# Patient Record
Sex: Male | Born: 1990
Health system: Southern US, Community
[De-identification: ages and names within clinical notes are randomized; demographics above are authoritative.]

## PROBLEM LIST (undated history)

## (undated) DIAGNOSIS — T7840XA Allergy, unspecified, initial encounter: Secondary | ICD-10-CM

## (undated) DIAGNOSIS — J45909 Unspecified asthma, uncomplicated: Secondary | ICD-10-CM

## (undated) HISTORY — DX: Allergy, unspecified, initial encounter: T78.40XA

---

## 2013-10-21 ENCOUNTER — Encounter (HOSPITAL_COMMUNITY): Payer: Self-pay | Admitting: Emergency Medicine

## 2013-10-21 ENCOUNTER — Emergency Department (HOSPITAL_COMMUNITY)
Admission: EM | Admit: 2013-10-21 | Discharge: 2013-10-21 | Disposition: A | Discharge status: Home / Self Care | Payer: BC Managed Care – PPO | Attending: Emergency Medicine | Admitting: Emergency Medicine

## 2013-10-21 DIAGNOSIS — H5789 Other specified disorders of eye and adnexa: Secondary | ICD-10-CM | POA: Insufficient documentation

## 2013-10-21 DIAGNOSIS — J069 Acute upper respiratory infection, unspecified: Secondary | ICD-10-CM | POA: Insufficient documentation

## 2013-10-21 DIAGNOSIS — F17200 Nicotine dependence, unspecified, uncomplicated: Secondary | ICD-10-CM | POA: Diagnosis not present

## 2013-10-21 DIAGNOSIS — R079 Chest pain, unspecified: Secondary | ICD-10-CM | POA: Insufficient documentation

## 2013-10-21 DIAGNOSIS — J45901 Unspecified asthma with (acute) exacerbation: Secondary | ICD-10-CM | POA: Insufficient documentation

## 2013-10-21 DIAGNOSIS — R51 Headache: Secondary | ICD-10-CM | POA: Diagnosis not present

## 2013-10-21 DIAGNOSIS — J029 Acute pharyngitis, unspecified: Secondary | ICD-10-CM | POA: Diagnosis not present

## 2013-10-21 HISTORY — DX: Unspecified asthma, uncomplicated: J45.909

## 2013-10-21 MED ORDER — ALBUTEROL SULFATE (2.5 MG/3ML) 0.083% IN NEBU
2.5000 mg | INHALATION_SOLUTION | Freq: Once | RESPIRATORY_TRACT | Status: AC
  Administered 2013-10-21: 2.5 mg via RESPIRATORY_TRACT
  Filled 2013-10-21: qty 3

## 2013-10-21 MED ORDER — PREDNISONE 20 MG PO TABS: 40.0000 mg | ORAL_TABLET | Freq: Every day | ORAL | Status: DC

## 2013-10-21 MED ORDER — IPRATROPIUM BROMIDE 0.02 % IN SOLN
0.5000 mg | Freq: Once | RESPIRATORY_TRACT | Status: AC
  Administered 2013-10-21: 0.5 mg via RESPIRATORY_TRACT
  Filled 2013-10-21: qty 2.5

## 2013-10-21 MED ORDER — ALBUTEROL SULFATE HFA 108 (90 BASE) MCG/ACT IN AERS
2.0000 | INHALATION_SPRAY | Freq: Once | RESPIRATORY_TRACT | Status: AC
Start: 2013-10-21 — End: 2013-10-21
  Administered 2013-10-21: 2 via RESPIRATORY_TRACT
  Filled 2013-10-21: qty 6.7

## 2013-10-21 NOTE — ED Notes (Signed)
Pt presents with nasal drainage, sore throat and difficulty breathing. +wheezing bilat, dry cough noted. Pt speaking in complete sentences.

## 2013-10-21 NOTE — Discharge Instructions (Signed)
Please read and follow all provided instructions.  Your diagnoses today include:  1. Asthma exacerbation   2. Upper respiratory infection     Tests performed today include:  Vital signs. See below for your results today.   Medications prescribed:   Albuterol inhaler - medication that opens up your airway  Use inhaler as follows: 1-2 puffs with spacer every 4 hours as needed for wheezing, cough, or shortness of breath.    Prednisone - steroid medicine   It is best to take this medication in the morning to prevent sleeping problems. If you are diabetic, monitor your blood sugar closely and stop taking Prednisone if blood sugar is over 300. Take with food to prevent stomach upset.   Take any prescribed medications only as directed.  Home care instructions:  Follow any educational materials contained in this packet.  Follow-up instructions: Please follow-up with your primary care provider in the next 3 days for further evaluation of your symptoms and management of your asthma.  Return instructions:   Please return to the Emergency Department if you experience worsening symptoms.  Please return with worsening wheezing, shortness of breath, or difficulty breathing.  Return with persistent fever above 101F.   Please return if you have any other emergent concerns.  Additional Information:  Your vital signs today were: BP 150/81   Pulse 90   Temp(Src) 98.8 F (37.1 C) (Oral)   Resp 20   Ht  (1.753 m)   Wt 180 lb (81.647 kg)   BMI 26.57 kg/m2   SpO2 97% If your blood pressure (BP) was elevated above 135/85 this visit, please have this repeated by your doctor within one month. --------------

## 2013-10-21 NOTE — ED Provider Notes (Signed)
Medical screening examination/treatment/procedure(s) were performed by non-physician practitioner and as supervising physician I was immediately available for consultation/collaboration.   EKG Interpretation None       Ana Liaw R. Verina Galeno, MD 10/21/13 2353 

## 2013-10-21 NOTE — ED Provider Notes (Signed)
CSN: [id number]     Arrival date & time 10/21/13  1940 History  This chart was scribed for non-physician practitioner working with [name], MD, by [name] ED Scribe. This patient was seen in room WTR6/WTR6 and the patient's care was started at 8:35 PM  Chief Complaint  Patient presents with  . Asthma   The history is provided by the patient and a parent. No language interpreter was used.   HPI Comments: Andre Cole is a 23 y.o. male with a history of asthma, who presents to the Emergency Department complaining of cough, rhinorrhea, sneezing, sore throat, and headache that began 3 days ago. Patient states that he does not use any medications for his asthma and per mother, his previous flare up was last year and he was diagnosed with bronchitis at the time. Patient states that he has been using allegra and halls with minimal relief. Patient denies associated fever or ear pain. Patient is a current some day smoker.  Past Medical History  Diagnosis Date  . Asthma    History reviewed. No pertinent past surgical history. No family history on file. History  Substance Use Topics  . Smoking status: Current Some Day Smoker    Types: Cigars  . Smokeless tobacco: Not on file  . Alcohol Use: Yes     Comment: occ    Review of Systems  Constitutional: Negative for fever and chills.  HENT: Positive for congestion, rhinorrhea, sneezing and sore throat. Negative for ear pain and sinus pressure.   Eyes: Positive for redness. Negative for discharge and itching.  Respiratory: Positive for cough, chest tightness and wheezing.   Gastrointestinal: Negative for nausea and vomiting.  Skin: Negative for color change.  Neurological: Positive for headaches.   Allergies  Review of patient's allergies indicates no known allergies.  Home Medications   Prior to Admission medications   Not on File   Triage Vitals: BP 150/81  Pulse 90  Temp(Src) 98.8 F (37.1 C) (Oral)  Resp 20  Ht   (1.753 m)  Wt 180 lb (81.647 kg)  BMI 26.57 kg/m2  SpO2 97%  Physical Exam  Nursing note and vitals reviewed. Constitutional: He is oriented to person, place, and time. He appears well-developed and well-nourished. No distress.  HENT:  Head: Normocephalic and atraumatic.  Right Ear: Tympanic membrane, external ear and ear canal normal.  Left Ear: Tympanic membrane, external ear and ear canal normal.  Nose: Mucosal edema and rhinorrhea present.  Mouth/Throat: Uvula is midline and mucous membranes are normal. Mucous membranes are not dry. No trismus in the jaw. No uvula swelling. Posterior oropharyngeal erythema present. No oropharyngeal exudate, posterior oropharyngeal edema or tonsillar abscesses.  Eyes: Conjunctivae and EOM are normal. Right eye exhibits no discharge. Left eye exhibits no discharge.  Neck: Normal range of motion. Neck supple. No tracheal deviation present.  Cardiovascular: Normal rate, regular rhythm and normal heart sounds.   Pulmonary/Chest: Effort normal. No respiratory distress. He has wheezes (lungs bilaterally, expiratory). He has no rales.  Abdominal: Soft. There is no tenderness.  Musculoskeletal: Normal range of motion.  Lymphadenopathy:    He has no cervical adenopathy.  Neurological: He is alert and oriented to person, place, and time.  Skin: Skin is warm and dry.  Psychiatric: He has a normal mood and affect. His behavior is normal.   ED Course  Procedures (including critical care time)  DIAGNOSTIC STUDIES: Oxygen Saturation is 97% on RA, normal by my interpretation.  COORDINATION OF CARE: 8:39 PM- Discussed plans to order nebulizer breathing treatment. Will prescribe prednisone to patient. Pt advised of plan for treatment and pt agrees.  Labs Review Labs Reviewed - No data to display  Imaging Review No results found.   EKG Interpretation None      Vital signs reviewed and are as follows: Filed Vitals:   10/21/13 2144  BP: 125/70   Pulse: 82  Temp:   Resp: 20   9:46 PM patient notes improvement after breathing treatment. He has only mild bibasilar expiratory wheeze. He was offered another breathing treatment and he declines. Will discharge to home with albuterol HFA. Patient encouraged to fill and take prednisone if his symptoms persist for more than 24-48 hours. Patient does not want to start prednisone now because of its side effects. No prednisone given in ED. Counseled on supportive care for URI symptoms. Patient urged to return with fever, worsening trouble breathing, increased work of breathing, or other concerns.   MDM   Final diagnoses:  Asthma exacerbation  Upper respiratory infection   Asthma: treated in emergency department with breathing treatment with improvement. Prednisone for home if patient has persistent symptoms. Patient declines initiation of prednisone now. Patient has URI which is triggered. I do not suspect pneumonia given lung exam and no fever.  URI: Treat supportively.  I personally performed the services described in this documentation, which was scribed in my presence. The recorded information has been reviewed and is accurate.    Renne Crigler, PA-C 10/21/13 2149

## 2014-04-25 ENCOUNTER — Ambulatory Visit (INDEPENDENT_AMBULATORY_CARE_PROVIDER_SITE_OTHER): Payer: BLUE CROSS/BLUE SHIELD

## 2014-04-25 ENCOUNTER — Ambulatory Visit (INDEPENDENT_AMBULATORY_CARE_PROVIDER_SITE_OTHER): Payer: BLUE CROSS/BLUE SHIELD | Admitting: Emergency Medicine

## 2014-04-25 VITALS — BP 118/68 | HR 83 | Temp 98.8°F | Resp 18 | Ht 70.0 in | Wt 208.0 lb

## 2014-04-25 DIAGNOSIS — M545 Low back pain, unspecified: Secondary | ICD-10-CM

## 2014-04-25 DIAGNOSIS — M25511 Pain in right shoulder: Secondary | ICD-10-CM

## 2014-04-25 DIAGNOSIS — T148 Other injury of unspecified body region: Secondary | ICD-10-CM | POA: Diagnosis not present

## 2014-04-25 DIAGNOSIS — Z23 Encounter for immunization: Secondary | ICD-10-CM

## 2014-04-25 DIAGNOSIS — S43101A Unspecified dislocation of right acromioclavicular joint, initial encounter: Secondary | ICD-10-CM

## 2014-04-25 DIAGNOSIS — T07XXXA Unspecified multiple injuries, initial encounter: Secondary | ICD-10-CM

## 2014-04-25 MED ORDER — HYDROCODONE-ACETAMINOPHEN 5-325 MG PO TABS: 1.0000 | ORAL_TABLET | ORAL | Status: DC | PRN
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
## 2014-04-25 NOTE — Patient Instructions (Signed)
Shoulder Separation You have a shoulder separation (acromioclavicular separation). This occurs when an injury stretches or tears the ligaments that connect the top of the shoulder blade to the collar bone. Injury causes these bones to separate. A sling or splint may be applied to limit your range of motion. HOME CARE INSTRUCTIONS   Apply ice to the injury for 15-20 minutes each hour while awake for the first 2 days. Put the ice in a plastic bag. Place a towel between the bag of ice and your skin.  Avoid activities that increase pain.  Wear your sling or splint for as long as directed by your caregiver. If you remove the sling to dress or bathe, be sure your arm remains in the same position as when the sling is on. Do not lift your arm.  The splint must be tightened by another person every day. Tighten it enough to keep the shoulders held back. Allow enough room to place the index finger between the body and strap. Loosen the splint immediately if you feel numbness or tingling in your hands.  Only take over-the-counter or prescription medicines for pain, discomfort, or fever as directed by your caregiver.  If this is a severe injury, you may need a referral to a specialist. It is very important to keep any follow-up appointments in order to avoid any type of permanent shoulder disability or chronic pain problems. SEEK MEDICAL CARE IF:  Pain or swelling to the injury site increases and is not relieved with medications. SEEK IMMEDIATE MEDICAL CARE IF:  Your arm becomes numb, pale, cold, or there are abnormal feelings (sensations) shooting into your fingertips. Document Released: 11/06/2004 Document Revised: 06/13/2013 Document Reviewed: 09/08/2006 ExitCare Patient Information 2015 [employer]. This information is not intended to replace advice given to you by your health care provider. Make sure you discuss any questions you have with your health care provider.  

## 2014-12-13 ENCOUNTER — Telehealth: Payer: Self-pay | Admitting: Emergency Medicine
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
## 2014-12-15 NOTE — Telephone Encounter (Signed)
Faxed back.

## 2015-03-09 ENCOUNTER — Ambulatory Visit (INDEPENDENT_AMBULATORY_CARE_PROVIDER_SITE_OTHER): Payer: BLUE CROSS/BLUE SHIELD | Admitting: Physician Assistant

## 2015-03-09 VITALS — BP 120/70 | HR 71 | Temp 98.2°F | Resp 16 | Ht 69.75 in | Wt 211.8 lb

## 2015-03-09 DIAGNOSIS — Z202 Contact with and (suspected) exposure to infections with a predominantly sexual mode of transmission: Secondary | ICD-10-CM

## 2015-03-09 DIAGNOSIS — Z113 Encounter for screening for infections with a predominantly sexual mode of transmission: Secondary | ICD-10-CM

## 2015-03-09 MED ORDER — METRONIDAZOLE 500 MG PO TABS: 2000.0000 mg | ORAL_TABLET | Freq: Once | ORAL | Status: DC

## 2015-03-09 NOTE — Progress Notes (Signed)
   03/09/2015 2:24 PM   DOB: [birth year] / MRN: [id number]  SUBJECTIVE:  Andre Cole is a 25 y.o. male presenting today because his girlfriend tested positive for trichomas.  This was last Tuesday. She was also screened for HIV, syphillis, gonorhea and chlamydia and these were negative.  They have been together 4 years and this is now over.  Patient reports he had another partner in December and contracted the illness and gave this to his ex girlfriend.    He is having no symptoms today.   He has No Known Allergies.   He  has a past medical history of Asthma and Allergy.    He  reports that he has been smoking Cigars.  He does not have any smokeless tobacco history on file. He reports that he drinks about 0.6 oz of alcohol per week. He reports that he does not use illicit drugs. He  has no sexual activity history on file. The patient  has no past surgical history on file.  His family history is not on file.  Review of Systems  Constitutional: Negative for fever.  Gastrointestinal: Negative for nausea and abdominal pain.  Genitourinary: Negative for dysuria, urgency and frequency.  Musculoskeletal: Negative for myalgias.  Skin: Negative for rash.    Problem list and medications reviewed and updated by myself where necessary, and exist elsewhere in the encounter.   OBJECTIVE:  BP 120/70 mmHg  Pulse 71  Temp(Src) 98.2 F (36.8 C) (Oral)  Resp 16  Ht 5' 9.75" (1.772 m)  Wt 211 lb 12.8 oz (96.072 kg)  BMI 30.60 kg/m2  SpO2 98%  Physical Exam  Constitutional: He is oriented to person, place, and time.  Cardiovascular: Normal rate.   Pulmonary/Chest: Effort normal.  Neurological: He is alert and oriented to person, place, and time.  Skin: Skin is warm.  Vitals reviewed.   No results found for this or any previous visit (from the past 72 hour(s)).  No results found.  ASSESSMENT AND PLAN  Chaze was seen today for std testing.  Diagnoses and all orders for this  visit:  Exposure to trichomonas  Routine screening for STI (sexually transmitted infection) -     HIV antibody -     RPR -     GC/Chlamydia Probe Amp  Other orders -     metroNIDAZOLE (FLAGYL) 500 MG tablet; Take 4 tablets (2,000 mg total) by mouth once. Do not consume alcohol with this medication.   The patient was advised to call or return to clinic if he does not see an improvement in symptoms or to seek the care of the closest emergency department if he worsens with the above plan.   Deliah Boston, MHS, PA-C [facility] 03/09/2015 2:24 PM

## 2015-03-10 LAB — HIV ANTIBODY (ROUTINE TESTING W REFLEX): HIV: NONREACTIVE

## 2015-03-10 LAB — RPR

## 2015-03-10 LAB — GC/CHLAMYDIA PROBE AMP
CT Probe RNA: NOT DETECTED
GC Probe RNA: NOT DETECTED

## 2016-02-06 IMAGING — CR DG LUMBAR SPINE COMPLETE 4+V
5 series · 5 of 5 positions shown · non-contrast
Comparison: None.

CLINICAL DATA: Right-sided lower back pain without sciatica. Pain
in the joint, shoulder region.

EXAM:
LUMBAR SPINE - COMPLETE 4+ VIEW

[AP (1 of 2)]
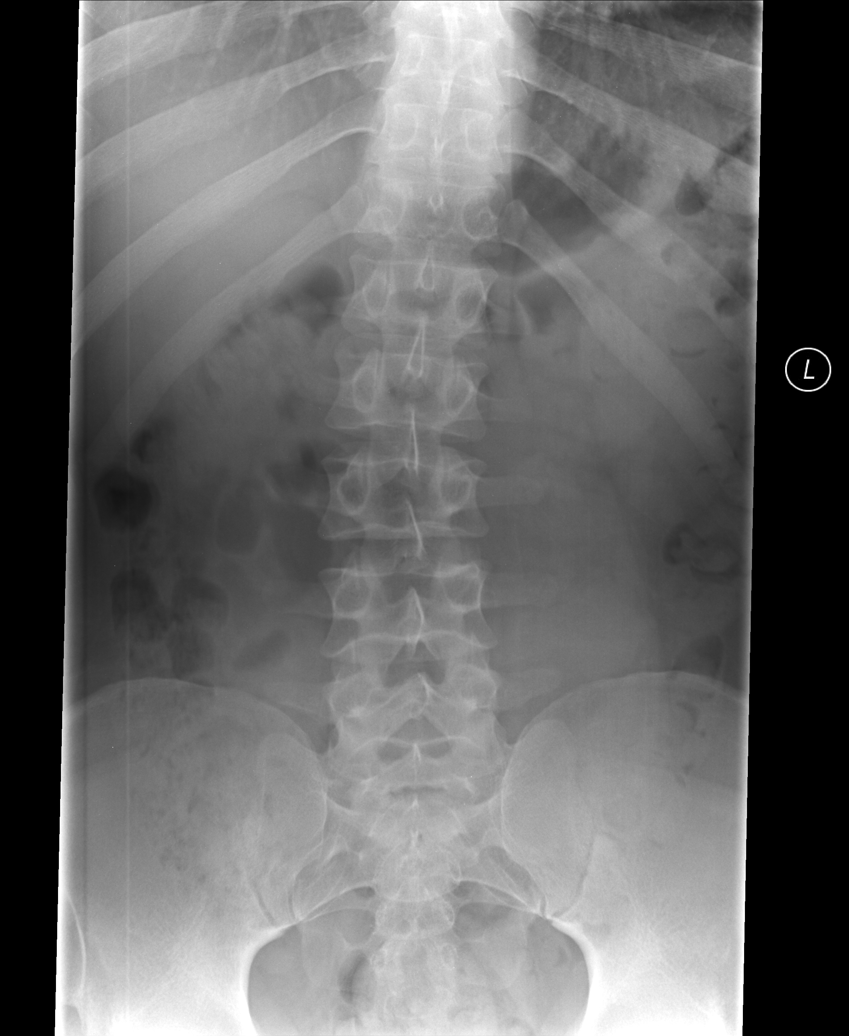

[AP (2 of 2)]
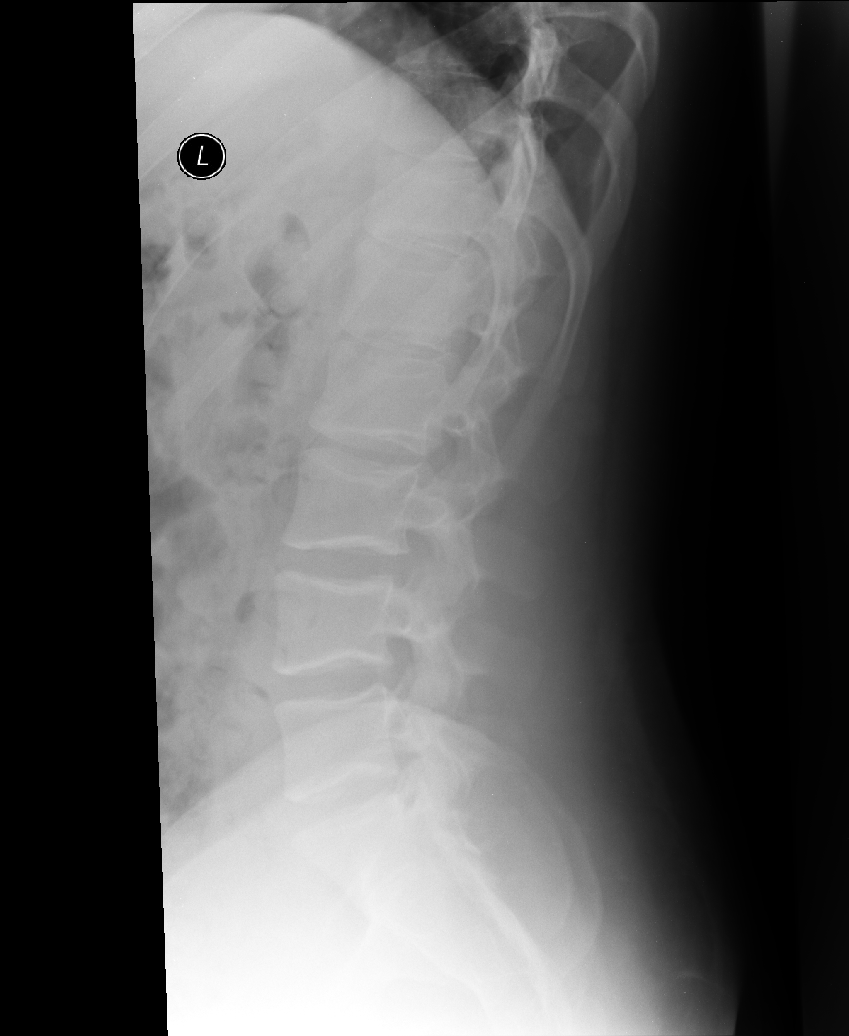

[l5 s1]
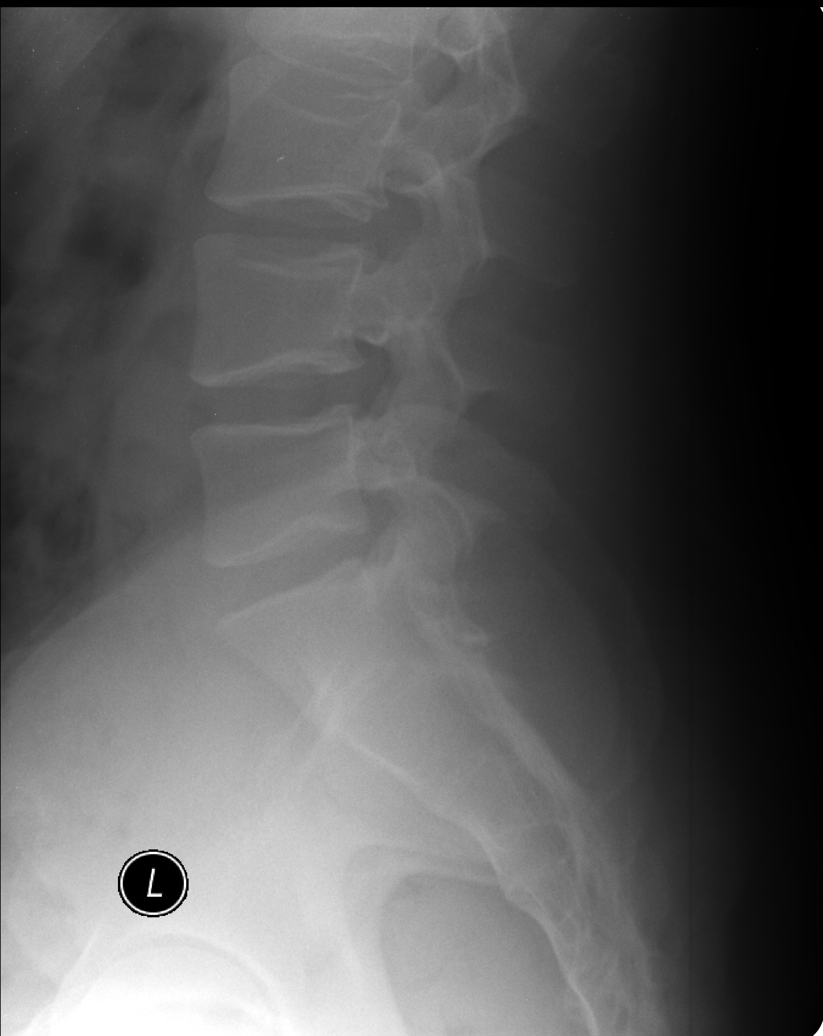

[rpo]
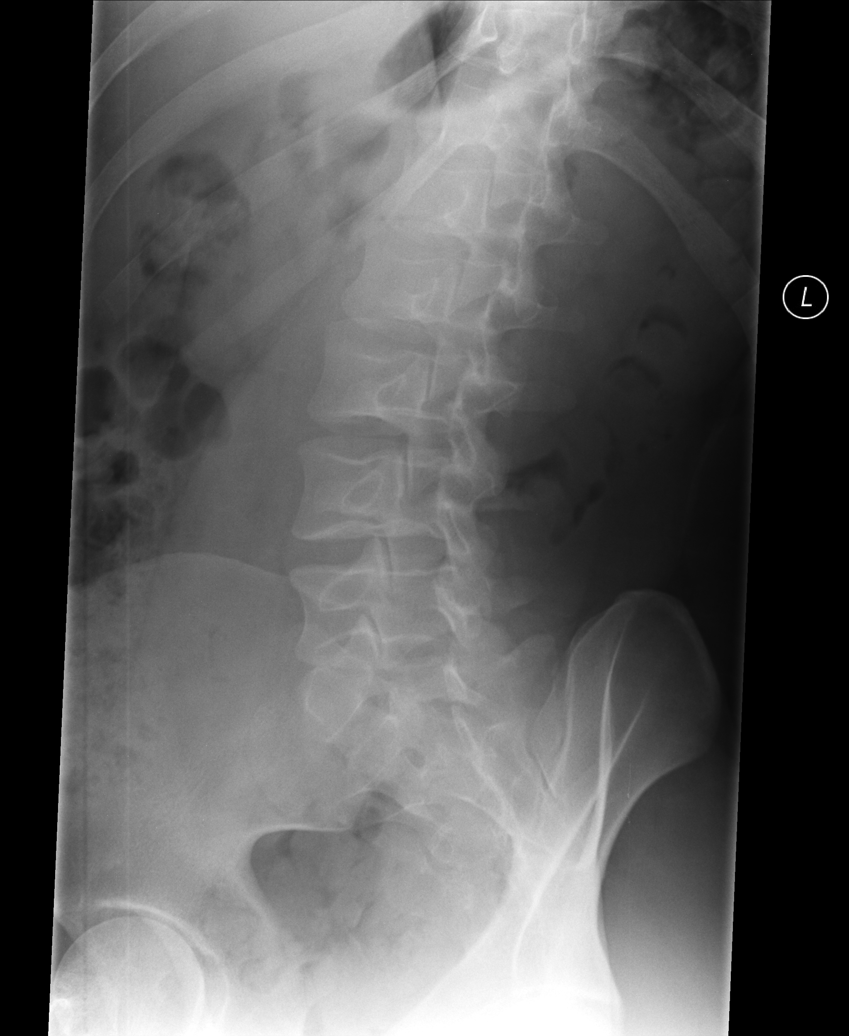

[lpo]
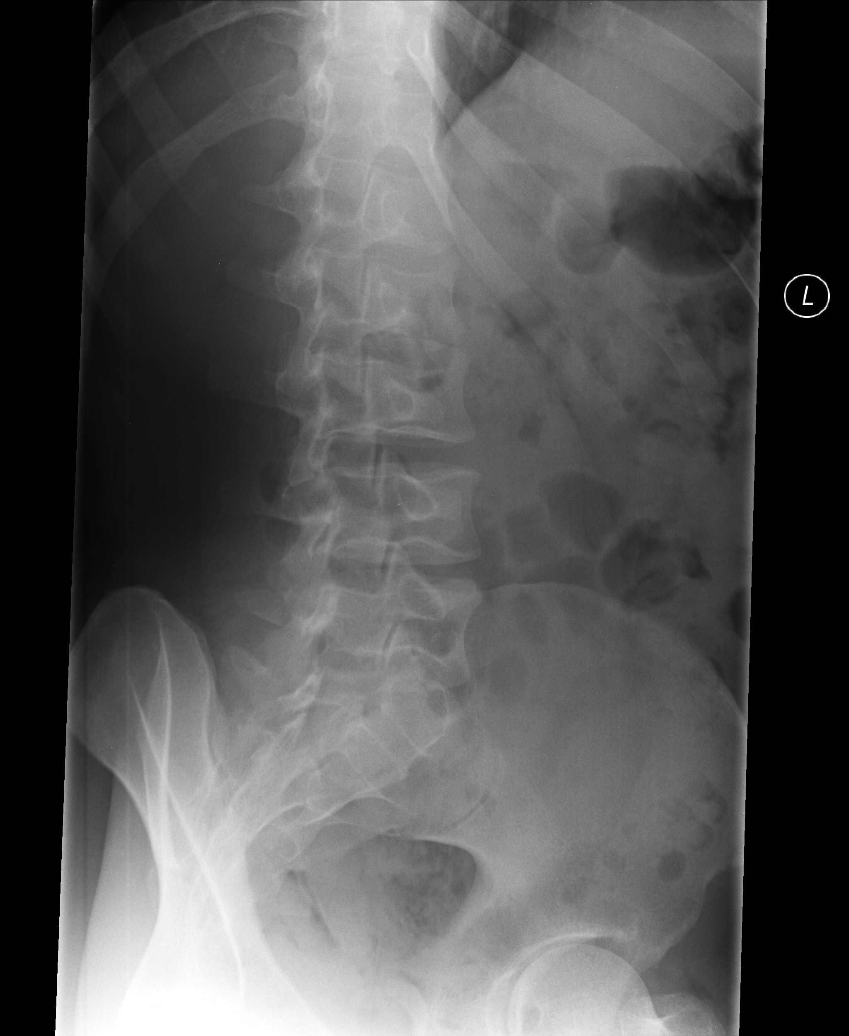

[5 of 5 positions shown; findings below may reference images not displayed]

FINDINGS: There is no evidence of lumbar spine fracture. Alignment is normal.
Intervertebral disc spaces are maintained.
IMPRESSION: Negative.

## 2016-02-06 IMAGING — CR DG HIP (WITH OR WITHOUT PELVIS) 2-3V*R*
3 series · 3 of 3 positions shown · non-contrast
Comparison: None.

CLINICAL DATA: Right lower back pain and right hip pain. Initial
encounter.

EXAM:
RIGHT HIP (WITH PELVIS) 2-3 VIEWS

[AP (1 of 2)]
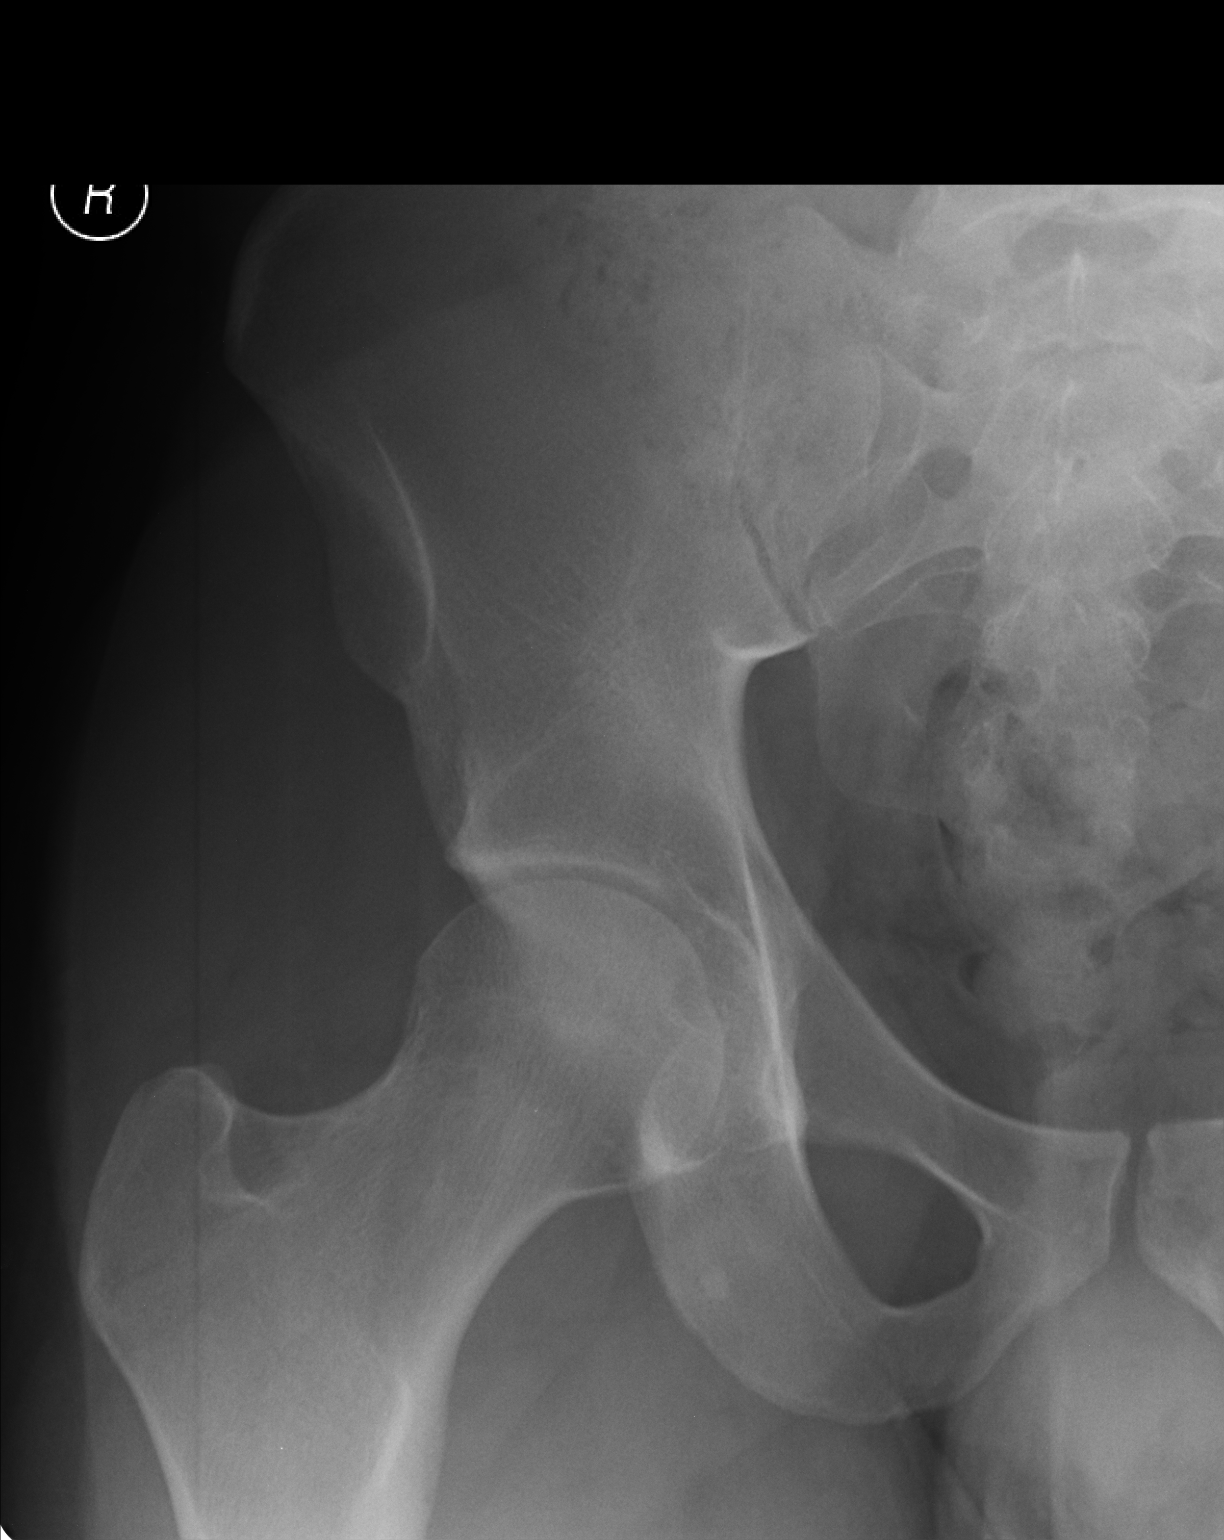

[lateral]
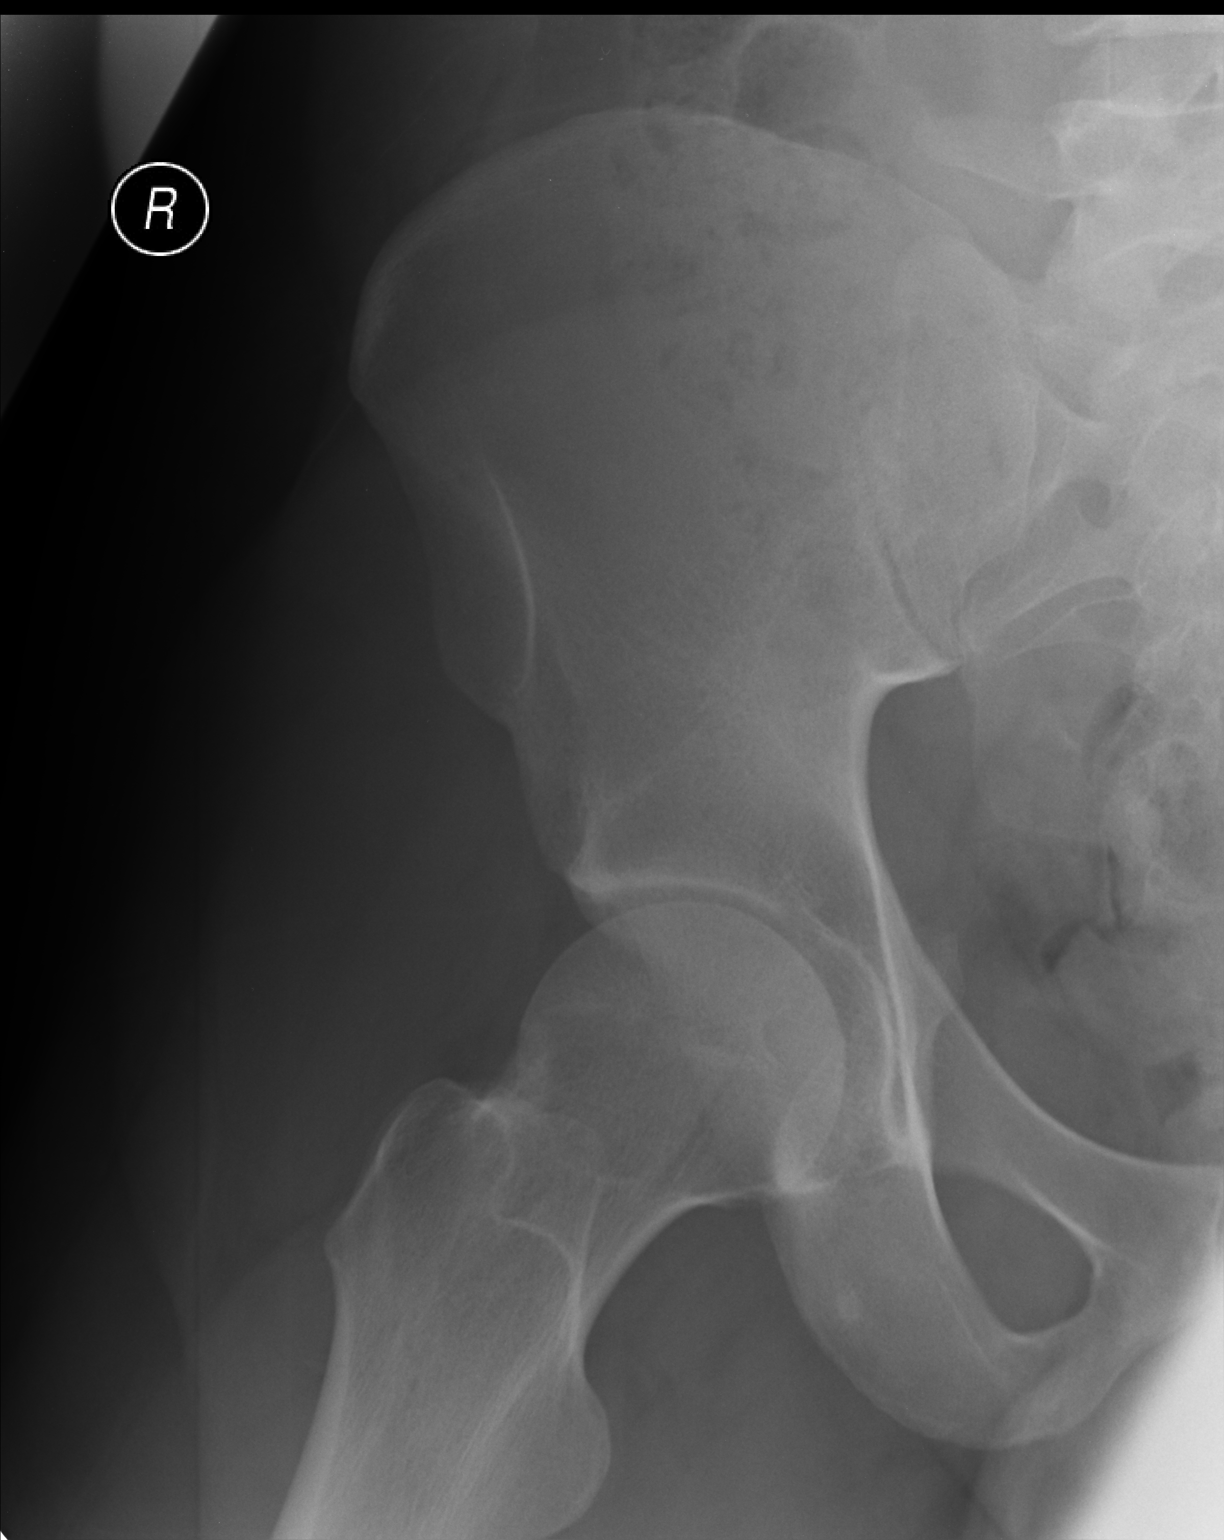

[AP (2 of 2)]
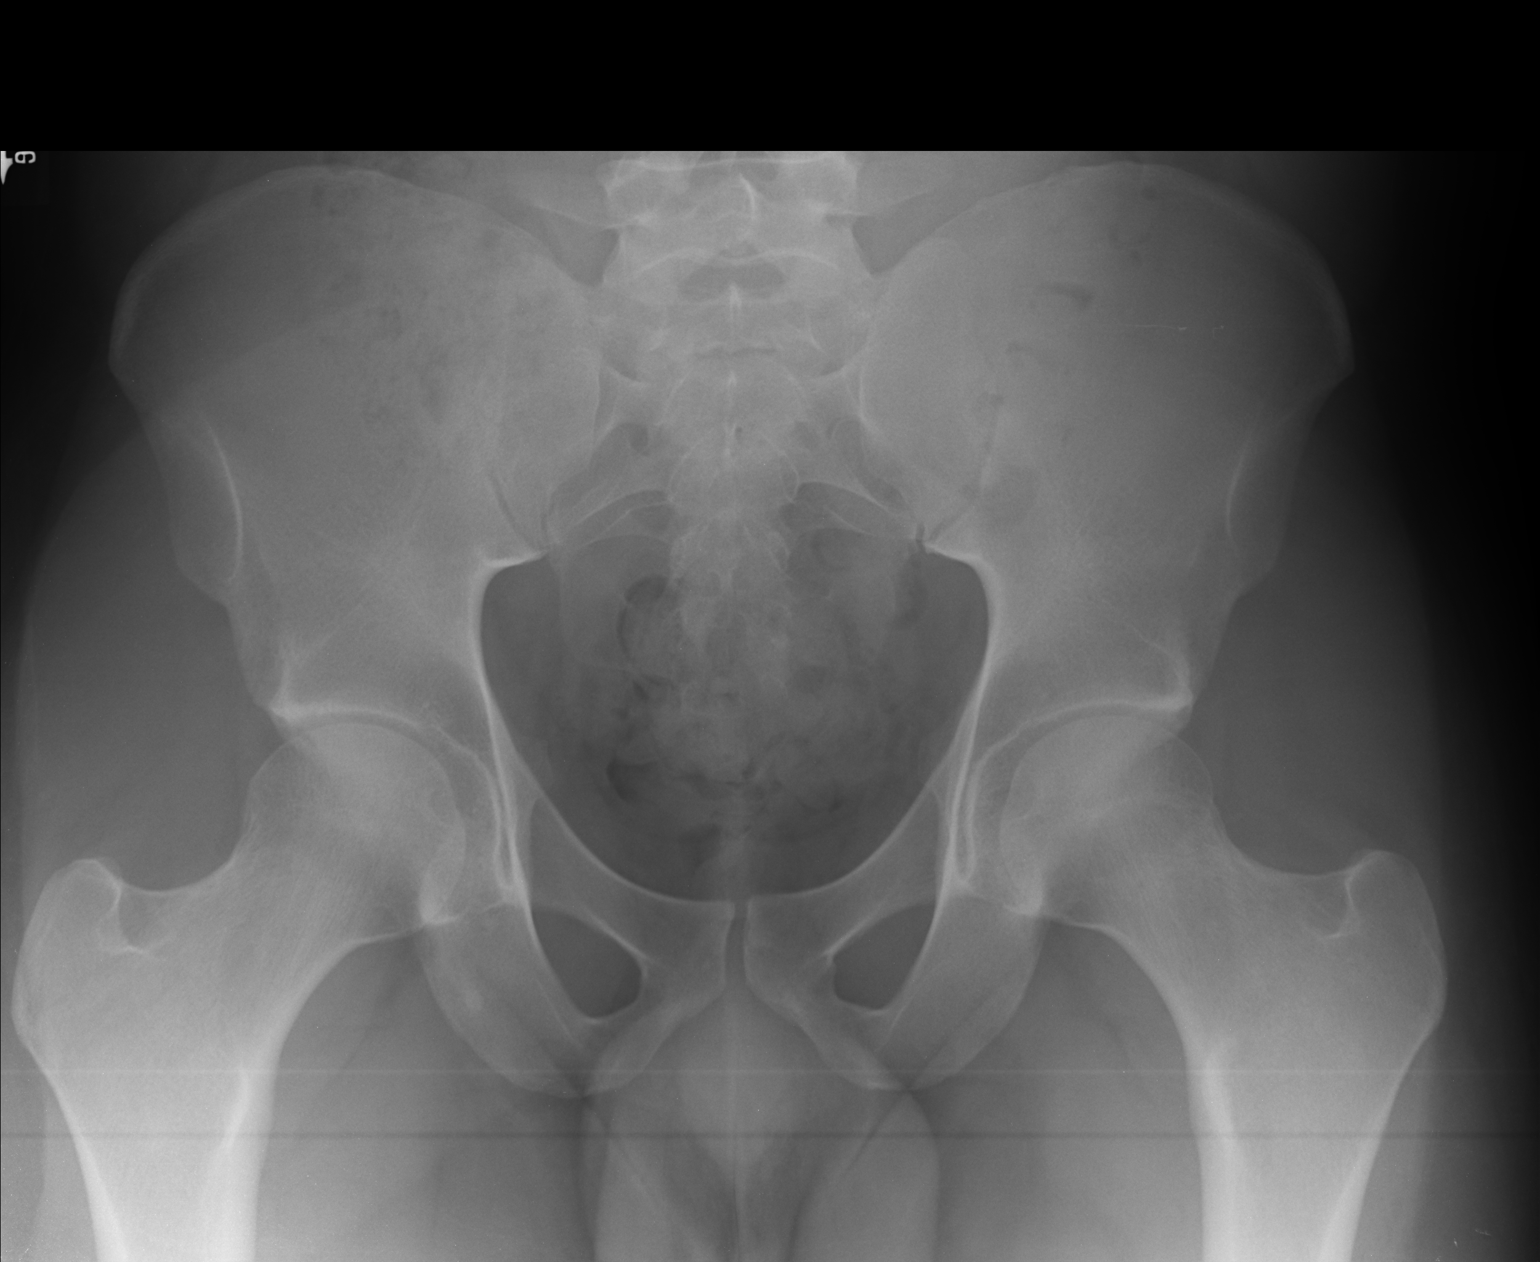

[3 of 3 positions shown; findings below may reference images not displayed]

FINDINGS: There is no evidence of fracture or dislocation. Both femoral heads
are seated normally within their respective acetabula. The proximal
right femur appears intact. No significant degenerative change is
appreciated. The sacroiliac joints are unremarkable in appearance.

The visualized bowel gas pattern is grossly unremarkable in
appearance.
IMPRESSION: No evidence of fracture or dislocation.

## 2016-05-01 ENCOUNTER — Ambulatory Visit (INDEPENDENT_AMBULATORY_CARE_PROVIDER_SITE_OTHER): Payer: BLUE CROSS/BLUE SHIELD | Admitting: Physician Assistant

## 2016-05-01 VITALS — BP 120/58 | HR 85 | Temp 98.3°F | Resp 16 | Ht 69.5 in | Wt 201.8 lb

## 2016-05-01 DIAGNOSIS — Z113 Encounter for screening for infections with a predominantly sexual mode of transmission: Secondary | ICD-10-CM | POA: Diagnosis not present

## 2016-05-01 DIAGNOSIS — Z114 Encounter for screening for human immunodeficiency virus [HIV]: Secondary | ICD-10-CM | POA: Diagnosis not present

## 2016-05-01 DIAGNOSIS — Z23 Encounter for immunization: Secondary | ICD-10-CM

## 2016-05-01 DIAGNOSIS — Z13228 Encounter for screening for other metabolic disorders: Secondary | ICD-10-CM | POA: Diagnosis not present

## 2016-05-01 DIAGNOSIS — Z13220 Encounter for screening for lipoid disorders: Secondary | ICD-10-CM

## 2016-05-01 DIAGNOSIS — Z8709 Personal history of other diseases of the respiratory system: Secondary | ICD-10-CM | POA: Insufficient documentation

## 2016-05-01 DIAGNOSIS — Z9109 Other allergy status, other than to drugs and biological substances: Secondary | ICD-10-CM

## 2016-05-01 DIAGNOSIS — Z0000 Encounter for general adult medical examination without abnormal findings: Secondary | ICD-10-CM

## 2016-05-01 DIAGNOSIS — Z130 Encounter for screening for diseases of the blood and blood-forming organs and certain disorders involving the immune mechanism: Secondary | ICD-10-CM

## 2016-05-01 NOTE — Progress Notes (Signed)
Andre Cole  MRN: [id number] DOB: [birth year]  PCP: No PCP Per Patient  Subjective:  Pt presents to clinic for a CPE.  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 H/O extrinsic asthma 05/01/2016  . Environmental allergies 05/01/2016    Review of Systems  Constitutional: Negative.   HENT: Negative.   Eyes: Negative.   Respiratory: Negative.   Cardiovascular: Negative.   Gastrointestinal: Negative.   Endocrine: Negative.   Genitourinary: Negative.   Musculoskeletal: Negative.   Skin: Negative.   Allergic/Immunologic: Negative.   Neurological: Negative.   Hematological: Negative.   Psychiatric/Behavioral: Negative.      Current Outpatient Prescriptions on File Prior to Visit  Medication Sig Dispense Refill  . fexofenadine (ALLEGRA) 180 MG tablet Take 180 mg by mouth daily. Reported on 03/09/2015     No current facility-administered medications on file prior to visit.     No Known Allergies  Social History   Social History  . Marital status: Single    Spouse name: N/A  . Number of children: N/A  . Years of education: N/A   Social History Main Topics  . Smoking status: Former Smoker    Types: Cigars    Quit date: 02/10/2014  . Smokeless tobacco: Never Used     Comment: no marjuina or cigars or cig since 2016 but vaps  . Alcohol use 0.6 oz/week    1 Standard drinks or equivalent per week     Comment: 2x/week - 2-4 liquior drinks a week  .  Drug use: No  . Sexual activity: Yes    Partners: Female   Other Topics Concern  . 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  No family history on file.   Objective:  BP (!) 120/58 (BP Location: Right Arm, Patient Position: Sitting, Cuff Size: Large)   Pulse 85   Temp 98.3 F (36.8 C) (Oral)   Resp 16   Ht 5' 9.5" (1.765 m)   Wt 201 lb 12.8 oz (91.5 kg)   SpO2 93%   BMI 29.37 kg/m   Physical Exam  Constitutional: He is oriented to person, place, and time and well-developed, well-nourished, and in no distress.  HENT:  Head: Normocephalic and atraumatic.  Right Ear: Hearing, tympanic membrane, external ear and ear canal normal.  Left Ear: Hearing, tympanic membrane, external ear and ear canal normal.  Nose: Nose normal.  Mouth/Throat: Uvula is midline, oropharynx is clear and moist and mucous membranes are normal.  Eyes: Conjunctivae and EOM are normal. Pupils are equal, round, and reactive to  light.  Neck: Trachea normal and normal range of motion. Neck supple. No thyroid mass and no thyromegaly present.  Cardiovascular: Normal rate, regular rhythm and normal heart sounds.   No murmur heard. Pulmonary/Chest: Effort normal and breath sounds normal.  Abdominal: Soft. Bowel sounds are normal. Hernia confirmed negative in the right inguinal area and confirmed negative in the left inguinal area.  Genitourinary: Testes/scrotum normal and penis normal.  Musculoskeletal: Normal range of motion.  Neurological: He is alert and oriented to person, place, and time. Gait normal.  Skin: Skin is warm and dry.  Tattoo presents on abd and back  Psychiatric: Mood, memory, affect and judgment normal.    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  Windell Hummingbird PA-C  [facility] 05/06/2016 9:36 PM

## 2016-05-01 NOTE — Patient Instructions (Addendum)
I will contact you with your lab results as soon as they are available.   If you have not heard from me in 2 weeks, please contact me.  The fastest way to get your results is to register for My Chart (see the instructions on the last page of this printout).    IF you received an x-ray today, you will receive an invoice from [facility]. Please contact [facility] at [phone] with questions or concerns regarding your invoice.   IF you received labwork today, you will receive an invoice from [facility]. Please contact [facility] at [phone] with questions or concerns regarding your invoice.   Our billing staff will not be able to assist you with questions regarding bills from these companies.  You will be contacted with the lab results as soon as they are available. The fastest way to get your results is to activate your My Chart account. Instructions are located on the last page of this paperwork. If you have not heard from us regarding the results in 2 weeks, please contact this office.    Keeping you healthy  Get these tests  Blood pressure- Have your blood pressure checked once a year by your healthcare provider.  Normal blood pressure is 120/80.  Weight- Have your body mass index (BMI) calculated to screen for obesity.  BMI is a measure of body fat based on height and weight. You can also calculate your own BMI at https://www.west-esparza.com/www.nhlbisupport.com/bmi/.  Cholesterol- Have your cholesterol checked regularly starting at age 26, sooner may be necessary if you have diabetes, high blood pressure, if a family member developed heart diseases at an early age or if you smoke.   Chlamydia, HIV, and other sexual transmitted disease- Get screened each year until the age of 26 then within three months of each new sexual partner.  Diabetes- Have your blood sugar checked regularly if you have high blood pressure, high cholesterol, a family history of diabetes or if you are overweight.  Get  these vaccines  Flu shot- Every fall.  Tetanus shot- Every 10 years.  Menactra- Single dose; prevents meningitis.  Take these steps  Don't smoke- If you do smoke, ask your healthcare provider about quitting. For tips on how to quit, go to www.smokefree.gov or call 1-800-QUIT-NOW.  Be physically active- Exercise 5 days a week for at least 30 minutes.  If you are not already physically active start slow and gradually work up to 30 minutes of moderate physical activity.  Examples of moderate activity include walking briskly, mowing the yard, dancing, swimming bicycling, etc.  Eat a healthy diet- Eat a variety of healthy foods such as fruits, vegetables, low fat milk, low fat cheese, yogurt, lean meats, poultry, fish, beans, tofu, etc.  For more information on healthy eating, go to www.thenutritionsource.org  Drink alcohol in moderation- Limit alcohol intake two drinks or less a day.  Never drink and drive.  Dentist- Brush and floss teeth twice daily; visit your dentis twice a year.  Depression-Your emotional health is as important as your physical health.  If you're feeling down, losing interest in things you normally enjoy please talk with your healthcare provider.  Gun Safety- If you keep a gun in your home, keep it unloaded and with the safety lock on.  Bullets should be stored separately.  Helmet use- Always wear a helmet when riding a motorcycle, bicycle, rollerblading or skateboarding.  Safe sex- If you may be exposed to a sexually transmitted infection, use a condom  Seat belts- Seat bels can save your life; always wear one.  Smoke/Carbon Monoxide detectors- These detectors need to be installed on the appropriate level of your home.  Replace batteries at least once a year.  Skin Cancer- When out in the sun, cover up and use sunscreen SPF 15 or higher.  Violence- If anyone is threatening or hurting you, please tell your healthcare provider.

## 2016-05-02 LAB — CBC WITH DIFFERENTIAL/PLATELET
BASOS: 0 %
Basophils Absolute: 0 10*3/uL (ref 0.0–0.2)
EOS (ABSOLUTE): 0.3 10*3/uL (ref 0.0–0.4)
Eos: 3 %
Hematocrit: 45.8 % (ref 37.5–51.0)
Hemoglobin: 15.2 g/dL (ref 13.0–17.7)
IMMATURE GRANS (ABS): 0 10*3/uL (ref 0.0–0.1)
Immature Granulocytes: 0 %
LYMPHS ABS: 1.9 10*3/uL (ref 0.7–3.1)
LYMPHS: 20 %
MCH: 27.7 pg (ref 26.6–33.0)
MCHC: 33.2 g/dL (ref 31.5–35.7)
MCV: 83 fL (ref 79–97)
Monocytes Absolute: 0.5 10*3/uL (ref 0.1–0.9)
Monocytes: 6 %
Neutrophils Absolute: 6.6 10*3/uL (ref 1.4–7.0)
Neutrophils: 71 %
Platelets: 324 10*3/uL (ref 150–379)
RBC: 5.49 x10E6/uL (ref 4.14–5.80)
RDW: 14.5 % (ref 12.3–15.4)
WBC: 9.4 10*3/uL (ref 3.4–10.8)

## 2016-05-02 LAB — CMP14+EGFR
A/G RATIO: 1.7 (ref 1.2–2.2)
ALBUMIN: 4.6 g/dL (ref 3.5–5.5)
ALK PHOS: 92 IU/L (ref 39–117)
ALT: 14 IU/L (ref 0–44)
AST: 23 IU/L (ref 0–40)
BILIRUBIN TOTAL: 0.5 mg/dL (ref 0.0–1.2)
BUN / CREAT RATIO: 19 (ref 9–20)
BUN: 14 mg/dL (ref 6–20)
CO2: 27 mmol/L (ref 18–29)
Calcium: 9.8 mg/dL (ref 8.7–10.2)
Chloride: 99 mmol/L (ref 96–106)
Creatinine, Ser: 0.75 mg/dL — ABNORMAL LOW (ref 0.76–1.27)
GFR calc non Af Amer: 127 mL/min/{1.73_m2} (ref >59)
GFR, EST AFRICAN AMERICAN: 147 mL/min/{1.73_m2} (ref >59)
Globulin, Total: 2.7 g/dL (ref 1.5–4.5)
Glucose: 92 mg/dL (ref 65–99)
POTASSIUM: 4.8 mmol/L (ref 3.5–5.2)
SODIUM: 142 mmol/L (ref 134–144)
Total Protein: 7.3 g/dL (ref 6.0–8.5)

## 2016-05-02 LAB — LIPID PANEL
Chol/HDL Ratio: 4.9 ratio units (ref 0.0–5.0)
Cholesterol, Total: 165 mg/dL (ref 100–199)
HDL: 34 mg/dL — ABNORMAL LOW (ref >39)
LDL Calculated: 87 mg/dL (ref 0–99)
Triglycerides: 220 mg/dL — ABNORMAL HIGH (ref 0–149)
VLDL Cholesterol Cal: 44 mg/dL — ABNORMAL HIGH (ref 5–40)

## 2016-05-02 LAB — HIV ANTIBODY (ROUTINE TESTING W REFLEX): HIV SCREEN 4TH GENERATION: NONREACTIVE

## 2016-05-02 LAB — SYPHILIS: RPR W/REFLEX TO RPR TITER AND TREPONEMAL ANTIBODIES, TRADITIONAL SCREENING AND DIAGNOSIS ALGORITHM: RPR Ser Ql: NONREACTIVE

## 2016-05-03 LAB — GC/CHLAMYDIA PROBE AMP
Chlamydia trachomatis, NAA: NEGATIVE
Neisseria gonorrhoeae by PCR: NEGATIVE

## 2016-09-04 ENCOUNTER — Telehealth: Payer: Self-pay | Admitting: Physician Assistant

## 2016-09-04 NOTE — Telephone Encounter (Signed)
Patient needs a forms completed based off his CPE in 04/2016 by Benny LennertSarah Weber. I gave the patient a copy of his form and I placed the other copy in Sarah's box on 09/04/16 please CALL THE PATIENT when the forms are ready to be picked up and place them UP FRONT IN THE PICK UP DRAWER when ready for him.  Thank you!

## 2016-09-08 NOTE — Telephone Encounter (Signed)
Please advise 

## 2016-09-08 NOTE — Telephone Encounter (Signed)
Left VM that paperwork is ready for pickup at front desk at 102.

## 2016-12-01 ENCOUNTER — Telehealth: Payer: Self-pay | Admitting: Physician Assistant

## 2016-12-01 NOTE — Telephone Encounter (Signed)
Pt also needs the copy of the actual PE summary

## 2016-12-01 NOTE — Telephone Encounter (Signed)
Pt left dept of justice forms to be filled out by Andre LennertSarah Cole from his March PE,   Best phone for pt is [phone]

## 2016-12-02 NOTE — Telephone Encounter (Signed)
Pt brought by another form that also needs to be filled out today.  [facility].

## 2016-12-03 NOTE — Telephone Encounter (Signed)
See message below aboutt forms pt need filled out by provider

## 2016-12-09 ENCOUNTER — Ambulatory Visit (INDEPENDENT_AMBULATORY_CARE_PROVIDER_SITE_OTHER): Payer: BLUE CROSS/BLUE SHIELD | Admitting: Physician Assistant

## 2016-12-09 DIAGNOSIS — Z111 Encounter for screening for respiratory tuberculosis: Secondary | ICD-10-CM

## 2016-12-09 NOTE — Progress Notes (Signed)

## 2016-12-09 NOTE — Telephone Encounter (Signed)
Pt coming to 104 to take care of this.

## 2016-12-09 NOTE — Telephone Encounter (Signed)
I have filled out the form - his color, peripheral vision and Tb screening has not been done.

## 2016-12-11 ENCOUNTER — Ambulatory Visit: Payer: BLUE CROSS/BLUE SHIELD | Admitting: Family Medicine

## 2016-12-11 LAB — TB SKIN TEST
Induration: 0 mm
TB Skin Test: NEGATIVE

## 2017-01-29 ENCOUNTER — Ambulatory Visit: Payer: BLUE CROSS/BLUE SHIELD | Admitting: Physician Assistant

## 2017-01-29 ENCOUNTER — Encounter: Payer: Self-pay | Admitting: Physician Assistant

## 2017-01-29 ENCOUNTER — Other Ambulatory Visit: Payer: Self-pay

## 2017-01-29 VITALS — BP 122/62 | HR 87 | Temp 98.8°F | Resp 18 | Ht 68.82 in | Wt 198.8 lb

## 2017-01-29 DIAGNOSIS — Z23 Encounter for immunization: Secondary | ICD-10-CM | POA: Diagnosis not present

## 2017-01-29 DIAGNOSIS — R229 Localized swelling, mass and lump, unspecified: Secondary | ICD-10-CM

## 2017-01-29 DIAGNOSIS — IMO0002 Reserved for concepts with insufficient information to code with codable children: Secondary | ICD-10-CM

## 2017-01-29 NOTE — Progress Notes (Deleted)
   Berdie OgrenDevonte Vajda  MRN: [id number] DOB: 05-Jun-1990  Subjective:  Berdie OgrenDevonte Klosowski is a 26 y.o. male seen in office today for a chief complaint of ***  Review of Systems  Patient Active Problem List   Diagnosis Date Noted  . H/O extrinsic asthma 05/01/2016  . Environmental allergies 05/01/2016    Current Outpatient Medications on File Prior to Visit  Medication Sig Dispense Refill  . fexofenadine (ALLEGRA) 180 MG tablet Take 180 mg by mouth daily. Reported on 03/09/2015     No current facility-administered medications on file prior to visit.     No Known Allergies   Objective:  BP 122/62 (BP Location: Left Arm, Patient Position: Sitting, Cuff Size: Large)   Pulse 87   Temp 98.8 F (37.1 C) (Oral)   Resp 18   Ht 5' 8.82" (1.748 m)   Wt 198 lb 12.8 oz (90.2 kg)   SpO2 98%   BMI 29.51 kg/m   Physical Exam  Assessment and Plan :  *** 1. Need for prophylactic vaccination and inoculation against influenza *** - Flu Vaccine QUAD 36+ mos IM    Benjiman CoreBrittany Wiseman PA-C  Primary Care at [facility]  [facility] 01/29/2017 2:56 PM

## 2017-01-29 NOTE — Progress Notes (Signed)
   Andre OgrenDevonte Mcnee  MRN: [id number] DOB: Dec 12, 1990  Subjective:  Andre Cole is a 26 y.o. male seen in office today for a chief complaint of lump on right arm x 1 day. He saw it yesterday. Denies pain, redness, warmth, numbness, tingling, and acute trauma. He lifts weights frequently without any issues. Has not tried anything for relief. Pt vapes daily. No other questions or concerns.   Review of Systems  Constitutional: Negative for activity change, chills, diaphoresis, fever and unexpected weight change.  Musculoskeletal: Negative for arthralgias, myalgias and neck stiffness.  Neurological: Negative for dizziness and light-headedness.    Patient Active Problem List   Diagnosis Date Noted  . H/O extrinsic asthma 05/01/2016  . Environmental allergies 05/01/2016    Current Outpatient Medications on File Prior to Visit  Medication Sig Dispense Refill  . fexofenadine (ALLEGRA) 180 MG tablet Take 180 mg by mouth daily. Reported on 03/09/2015     No current facility-administered medications on file prior to visit.     No Known Allergies   Objective:  BP 122/62 (BP Location: Left Arm, Patient Position: Sitting, Cuff Size: Large)   Pulse 87   Temp 98.8 F (37.1 C) (Oral)   Resp 18   Ht 5' 8.82" (1.748 m)   Wt 198 lb 12.8 oz (90.2 kg)   SpO2 98%   BMI 29.51 kg/m   Physical Exam  Constitutional: He is oriented to person, place, and time and well-developed, well-nourished, and in no distress.  HENT:  Head: Normocephalic and atraumatic.  Eyes: Conjunctivae are normal.  Neck: Normal range of motion.  Pulmonary/Chest: Effort normal.  Musculoskeletal:       Right upper arm: He exhibits no tenderness and no bony tenderness.       Left upper arm: He exhibits no tenderness, no bony tenderness, no swelling and no edema.  Neurological: He is alert and oriented to person, place, and time. Gait normal.  Skin: Skin is warm and dry.     Psychiatric: Affect normal.  Vitals  reviewed.   Assessment and Plan :  1. Mass Unclear etiology at this time. No concern for overlying skin infection. Will obtain US for further evaluation at this time.  While awaiting ultrasound to be scheduled, patient advised to return to clinic if he develops any new concerning symptoms.  - US SOFT TISSUE RT UPPER EXTREMITY LTD (NON-VASCULAR); Future  2. Need for prophylactic vaccination and inoculation against influenza - Flu Vaccine QUAD 36+ mos IM  Benjiman CoreBrittany Wiseman PA-C  [facility] at [facility]  [facility] 01/29/2017 2:56 PM

## 2017-01-29 NOTE — Patient Instructions (Addendum)
I have ordered an ultrasound on your arm for further evaluation, which will help us with further treatment plan. Typically it takes about a week to get this done. In the meantime, if you develop any pain you may take ibuprofen and apply ice to the affected area. Thank you for letting me participate in your health and well being.   IF you received an x-ray today, you will receive an invoice from [facility]. Please contact [facility] at [phone] with questions or concerns regarding your invoice.   IF you received labwork today, you will receive an invoice from [facility]. Please contact [facility] at [phone] with questions or concerns regarding your invoice.   Our billing staff will not be able to assist you with questions regarding bills from these companies.  You will be contacted with the lab results as soon as they are available. The fastest way to get your results is to activate your My Chart account. Instructions are located on the last page of this paperwork. If you have not heard from us regarding the results in 2 weeks, please contact this office.

## 2017-06-22 ENCOUNTER — Ambulatory Visit (HOSPITAL_COMMUNITY)
Admission: EM | Admit: 2017-06-22 | Discharge: 2017-06-22 | Disposition: A | Discharge status: Home / Self Care | Payer: BLUE CROSS/BLUE SHIELD | Attending: Urgent Care | Admitting: Urgent Care

## 2017-06-22 ENCOUNTER — Encounter (HOSPITAL_COMMUNITY): Payer: Self-pay | Admitting: Family Medicine

## 2017-06-22 DIAGNOSIS — H6121 Impacted cerumen, right ear: Secondary | ICD-10-CM | POA: Diagnosis not present

## 2017-06-22 DIAGNOSIS — H9201 Otalgia, right ear: Secondary | ICD-10-CM | POA: Diagnosis not present

## 2017-06-22 NOTE — ED Provider Notes (Signed)
  MRN: [id number] DOB: [birth year]  Subjective:   Andre Cole is a 27 y.o. male presenting for 2 day history of right ear fullness. Patient reports that he was trying to clean his ears with q-tips and peroxide. He now has slightly decreased hearing, right ear pain and feels like he pushed wax further in. Denies fever, tinnitus, ear drainage, n/v, abdominal pain. Denies smoking cigarettes.  No current facility-administered medications for this encounter.   Current Outpatient Medications:  .  fexofenadine (ALLEGRA) 180 MG tablet, Take 180 mg by mouth daily. Reported on 03/09/2015, Disp: , Rfl:    No Known Allergies  Past Medical History:  Diagnosis Date  . Allergy   . Asthma     Denies past surgical history.  Objective:   Vitals: BP 139/85   Pulse 73   Temp 98.6 F (37 C)   Resp 18   SpO2 99%   Physical Exam  Constitutional: He is oriented to person, place, and time. He appears well-developed and well-nourished.  HENT:  Right TM cerumen impacted. Left TM clear.   Cardiovascular: Normal rate.  Pulmonary/Chest: Effort normal.  Neurological: He is alert and oriented to person, place, and time.   Assessment and Plan :   Impacted cerumen of right ear  Right ear pain  Ear lavage performed in clinic today. Counseled on management of cerumen impaction and avoidance of q-tips.   Wallis Bamberg, PA-C 06/22/17 1735

## 2017-06-22 NOTE — ED Triage Notes (Signed)
Pt here for right ear fullness. sts that he tried to clean it and pushed the wax into his ear making it worse.

## 2018-01-16 ENCOUNTER — Ambulatory Visit (INDEPENDENT_AMBULATORY_CARE_PROVIDER_SITE_OTHER): Payer: BC Managed Care – PPO | Admitting: Osteopathic Medicine

## 2018-01-16 ENCOUNTER — Encounter: Payer: Self-pay | Admitting: Osteopathic Medicine

## 2018-01-16 VITALS — BP 122/71 | HR 68 | Temp 99.1°F | Ht 69.0 in | Wt 210.4 lb

## 2018-01-16 DIAGNOSIS — A630 Anogenital (venereal) warts: Secondary | ICD-10-CM | POA: Diagnosis not present

## 2018-01-16 DIAGNOSIS — B977 Papillomavirus as the cause of diseases classified elsewhere: Secondary | ICD-10-CM

## 2018-01-16 NOTE — Patient Instructions (Addendum)
Human Papillomavirus Human papillomavirus (HPV) is the most common sexually transmitted infection (STI). It is easy to pass it from person to person (contagious). HPV can cause cervical cancer, anal cancer, and genital warts. The genital warts can be seen and felt. HPV may not have any symptoms. It is possible to have HPV for a long time and not know it. You may pass HPV on to others without knowing it. Follow these instructions at home:  Take medicines as told by your doctor.  Use over-the-counter creams for itching as told by your doctor.  Keep all follow-up visits. Make sure to get Pap tests as told by your doctor.  Do not touch or scratch the warts.  Do not treat genital warts with medicines used for treating hand warts.  Do not have sex while you are getting treatment.  Do not douche or use tampons during treatment of HPV.  Tell your sex partner about your infection because he or she may also need treatment.  If you get pregnant, tell your doctor that you had HPV. Your doctor will watch your pregnancy closely. This is important to keep your baby safe.  After treatment, use condoms during sex to prevent future infections.  Have only one sex partner.  Have a sex partner who does not have other sex partners. Contact a doctor if:  The treated skin is red, swollen, or painful.  You have a fever.  You feel ill.  You feel lumps or pimple-like areas in and around your genital area.  You have bleeding of the vagina or the area that was treated.  You have pain during sex. This information is not intended to replace advice given to you by your health care provider. Make sure you discuss any questions you have with your health care provider. Document Released: 01/10/2008 Document Revised: 07/05/2015 Document Reviewed: 05/04/2013 Elsevier Interactive Patient Education  2017 [employer]        If you have lab work done today you will be contacted with your lab results  within the next 2 weeks.  If you have not heard from us then please contact us. The fastest way to get your results is to register for My Chart.   IF you received an x-ray today, you will receive an invoice from [facility]. Please contact [facility] at [phone] with questions or concerns regarding your invoice.   IF you received labwork today, you will receive an invoice from [facility]. Please contact [facility] at [phone] with questions or concerns regarding your invoice.   Our billing staff will not be able to assist you with questions regarding bills from these companies.  You will be contacted with the lab results as soon as they are available. The fastest way to get your results is to activate your My Chart account. Instructions are located on the last page of this paperwork. If you have not heard from us regarding the results in 2 weeks, please contact this office.

## 2018-01-16 NOTE — Progress Notes (Signed)
HPI: Andre Cole is a 27 y.o. male who  has a past medical history of Allergy and Asthma.  [facility], 01/16/18,  for chief complaint of:  Chief Complaint  Patient presents with  . std screening    possible wart on base of penius. exposure to HPV    Girlfriend's pap recently (+)HPV, unsure strain. He has a lesion on base of penis he's worried might be wart, has been present for about 2 mos.      Past medical history, surgical history, and family history reviewed.  Current medication list and allergy/intolerance information reviewed.   (See remainder of HPI, ROS, Phys Exam below)    ASSESSMENT/PLAN: The primary encounter diagnosis was HPV in male. A diagnosis of Warts, genital was also pertinent to this visit.  Procedure: cryotherapy of genital wart  Area was cleaned and cryotherapy applied x2 in usual fashion, patient tolerated procedure well.   Discussed HPV - no test available, NH is most young people will clear the virus, advised condom use. Monogamous w/ partner and recent testing reported negative, pt declines other STD testing today.     Follow-up plan: Return for GARSADIL VACCINE at your convenience .             ############################################ ############################################ ############################################ ############################################    Outpatient Encounter Medications as of 01/16/2018  Medication Sig  . fexofenadine (ALLEGRA) 180 MG tablet Take 180 mg by mouth daily. Reported on 03/09/2015   No facility-administered encounter medications on file as of 01/16/2018.    No Known Allergies    Review of Systems:  Constitutional: No recent illness  Skin: No  Rash, +other lesion as per HPI   Exam:  BP 122/71 (BP Location: Right Arm, Patient Position: Sitting, Cuff Size: Large)   Pulse 68   Temp 99.1 F (37.3 C) (Oral)   Ht 5\' 9"  (1.753 m)   Wt  210 lb 6.4 oz (95.4 kg)   SpO2 98%   BMI 31.07 kg/m   Constitutional: VS see above. General Appearance: alert, well-developed, well-nourished, NAD  Skin: warm, dry, intact. Warty growth at base of penis on L side, no other discharge or ulceration.      Visit summary with medication list and pertinent instructions was printed for patient to review, advised to alert us if any changes needed. All questions at time of visit were answered - patient instructed to contact office with any additional concerns. ER/RTC precautions were reviewed with the patient and understanding verbalized.   Follow-up plan: Return for GARSADIL VACCINE at your convenience .    Please note: voice recognition software was used to produce this document, and typos may escape review. Please contact Dr. Lyn HollingsheadAlexander for any needed clarifications.

## 2018-01-29 ENCOUNTER — Ambulatory Visit (INDEPENDENT_AMBULATORY_CARE_PROVIDER_SITE_OTHER): Payer: BC Managed Care – PPO | Admitting: Emergency Medicine

## 2018-01-29 DIAGNOSIS — Z23 Encounter for immunization: Secondary | ICD-10-CM

## 2018-01-29 NOTE — Progress Notes (Signed)
Nurse visit for 2nd HPV.  Patient received 1st one on 04/2016. Patient was advised to return in 5 months from today's date. Patient verbalized understanding.

## 2018-06-30 ENCOUNTER — Ambulatory Visit: Payer: BC Managed Care – PPO

## 2019-10-03 ENCOUNTER — Other Ambulatory Visit: Payer: Self-pay | Admitting: Chiropractic Medicine

## 2019-10-03 ENCOUNTER — Ambulatory Visit
Admission: RE | Admit: 2019-10-03 | Discharge: 2019-10-03 | Disposition: A | Discharge status: Home / Self Care | Payer: BC Managed Care – PPO | Source: Ambulatory Visit | Attending: Chiropractic Medicine | Admitting: Chiropractic Medicine

## 2019-10-03 ENCOUNTER — Other Ambulatory Visit: Payer: Self-pay

## 2019-10-03 DIAGNOSIS — R202 Paresthesia of skin: Secondary | ICD-10-CM

## 2019-10-19 ENCOUNTER — Telehealth: Payer: Self-pay | Admitting: Neurology

## 2019-10-19 ENCOUNTER — Encounter: Payer: Self-pay | Admitting: Neurology

## 2019-10-19 ENCOUNTER — Ambulatory Visit: Payer: BC Managed Care – PPO | Admitting: Neurology

## 2019-10-19 ENCOUNTER — Other Ambulatory Visit: Payer: Self-pay

## 2019-10-19 VITALS — BP 132/82 | HR 79 | Ht 69.0 in | Wt 187.0 lb

## 2019-10-19 DIAGNOSIS — R29898 Other symptoms and signs involving the musculoskeletal system: Secondary | ICD-10-CM

## 2019-10-19 DIAGNOSIS — R29818 Other symptoms and signs involving the nervous system: Secondary | ICD-10-CM | POA: Diagnosis not present

## 2019-10-19 DIAGNOSIS — R202 Paresthesia of skin: Secondary | ICD-10-CM

## 2019-10-19 DIAGNOSIS — R278 Other lack of coordination: Secondary | ICD-10-CM

## 2019-10-19 DIAGNOSIS — R292 Abnormal reflex: Secondary | ICD-10-CM | POA: Diagnosis not present

## 2019-10-19 DIAGNOSIS — G959 Disease of spinal cord, unspecified: Secondary | ICD-10-CM

## 2019-10-19 NOTE — Progress Notes (Signed)
Subjective:    Patient ID: Andre Cole is a 29 y.o. male.  HPI     Star Age, MD, PhD [facility] [address], [address] [place], [place] [address]  Dear Dr. Burnett Harry,  I saw your patient, Andre Cole, upon your kind request in my Neurologic clinic today for initial consultation of his paresthesias and weakness. The patient is Unaccompanied today.  As you know, Andre Cole is a 29 year old right-handed gentleman with an underlying medical history of asthma and allergies, who reports an approximately 3-week history of weakness affecting his lower extremities as well as tingling and mild numbness affecting his feet, particularly the heels and also intermittently his fingertips, and particularly the middle fingers bilaterally.  Initially, he had more symptoms on the left side with the left leg feeling weaker.  He denies any bowel or bladder dysfunction but has had abnormal sensation in the genital area, and a feeling that it takes longer for his ejaculation.  He does not have any erectile dysfunction.  He has not had any dizziness, no one-sided facial droop, no slurring of speech, no significant headaches, no blurry vision or double vision or loss of vision.  Some 6 or 7 months ago he was doing Media planner and was doing bench presses and weightlifting and felt at times that his bench press was harder.  When he was doing handstand push-ups, he felt weaker in his arms and suddenly let go, bumped his head.  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  Some 3 weeks into this he fell as he was chasing after her dog.  His training finished at the end of August.  He has an abnormal sensation that goes down his trunk and into the feet when he suddenly bends his neck.  He does not have any significant electric-like sensation when he slowly bends his neck. He had multiple x-rays through your office which I was able to  review.  He had a pelvic x-ray on 10/03/2019 which was negative, cervical spine x-ray on 10/03/2019 showed no abnormality seen in the cervical spine.  Thoracic spine x-ray with swimmer's on 10/03/2019 was negative.  Lumbar spine x-ray completed on 10/03/2019 was negative.  He denies any significant pain but he feels that the weakness has increased to a more all over weakness.  He saw orthopedics yesterday, Dr. Delilah Shan and a cervical spine MRI was discussed but not ordered.  I do not have records available from orthopedics for review today.  He had blood work through [facility] on 10/06/2019 and he pulled up his records on his portal.  CBC with differential was unremarkable, CMP unremarkable, BUN was 14, creatinine 0.8, TSH 2.04, HIV nonreactive, chlamydia test negative.  Total cholesterol 197, LDL 141, RPR nonreactive, B12 992.  He reports that he has been taking oral B12 supplement.  His Past Medical History Is Significant For: Past Medical History:  Diagnosis Date  . Allergy   . Asthma     His Past Surgical History Is Significant For: No past surgical history on file.  His Family History Is Significant For: No family history on file.  His Social History Is Significant For: Social History   Socioeconomic History  . Marital status: Single    Spouse name: Not on file  . Number of children: Not on file  . Years of education: Not on file  . Highest education level: Not on file  Occupational History  . Not on file  Tobacco Use  . Smoking status: Former Smoker    Types: Cigars    Quit date: 02/10/2014    Years since quitting: 5.6  . Smokeless tobacco: Never Used  . Tobacco comment: no marjuina or cigars or cig since 2016 but vaps  Vaping Use  . Vaping Use: Every day  Substance and Sexual Activity  . Alcohol use: Yes    Alcohol/week: 1.0 standard drink    Types: 1 Standard drinks or equivalent per week    Comment: 2x/week - 2-4 liquior drinks a week  . Drug use: No  . Sexual  activity: Yes    Partners: Female  Other Topics Concern  . 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 Difficulty of Paying Living Expenses: Not on file  Food Insecurity:   . Worried About Charity fundraiser in the Last Year: Not on file  . Ran Out of Food in the Last Year: Not on file  Transportation Needs:   . Lack of Transportation (Medical): Not on file  . Lack of Transportation (Non-Medical): Not on file  Physical Activity:   . Days of Exercise per Week: Not on file  . Minutes of Exercise per Session: Not on file  Stress:   . Feeling of Stress : Not on file  Social Connections:   . Frequency of Communication with Friends and Family: Not on file  . Frequency of Social Gatherings with Friends and Family: Not on file  . Attends Religious Services: Not on file  . Active Member of Clubs or Organizations: Not on file  . Attends Archivist Meetings: Not on file  . Marital Status: Not on file    His Allergies Are:  No Known Allergies:   His Current Medications Are:  Outpatient Encounter Medications as of 10/19/2019  Medication Sig  . [DISCONTINUED] fexofenadine (ALLEGRA) 180 MG tablet Take 180 mg by mouth daily. Reported on 03/09/2015 (Patient not taking: Reported on 10/19/2019)  . [DISCONTINUED] naproxen (NAPROSYN) 500 MG tablet SMARTSIG:1 Tablet(s) By Mouth Every 12 Hours PRN (Patient not taking: Reported on 10/19/2019)   No facility-administered encounter medications on file as of 10/19/2019.  :   Review of Systems:  Out of a complete 14 point review of systems, all are reviewed and negative with the exception of these symptoms as listed below:  Review of Systems  Neurological:       Here to  discuss, decrease in muscle strength in his lower legs. He also reports tightness in bilateral hands along with decrease sensations to his genitals.  Pt reports these sx started about 3 weeks ago. He denies any pain just an over all weakness has been noted.     Objective:  Neurological Exam  Physical Exam Physical Examination:   Vitals:   10/19/19 0905  BP: 132/82  Pulse: 79  SpO2: 95%   General Examination: The patient is a very pleasant 29 y.o. male in no acute distress. He appears well-developed and well-nourished and well groomed.   HEENT: Normocephalic, atraumatic, pupils are equal, round and reactive to light and accommodation.  Funduscopic exam is normal with sharp disc margins noted. Extraocular tracking is good without limitation to gaze excursion or nystagmus noted. Normal smooth pursuit is noted. Hearing is grossly intact. Tympanic membranes are clear bilaterally. Face is symmetric with normal facial animation and normal facial sensation. Speech is clear with no dysarthria noted. There is no hypophonia. There is no lip, neck/head, jaw or voice tremor. Neck is supple with full range of passive and active motion. There are no carotid bruits on auscultation. Oropharynx exam reveals: mild mouth dryness, good dental hygiene. Tongue protrudes centrally and palate elevates symmetrically.   Chest: Clear to auscultation without wheezing, rhonchi or crackles noted.  Heart: S1+S2+0, regular and normal without murmurs, rubs or gallops noted.   Abdomen: Soft, non-tender and non-distended with normal bowel sounds appreciated on auscultation.  Extremities: There is no pitting edema in the distal lower extremities bilaterally. Pedal pulses are intact.  Skin: Warm and dry without trophic changes noted. There are no varicose veins.  Musculoskeletal: exam reveals no obvious joint deformities, tenderness or joint swelling or erythema.   Neurologically:  Mental status: The patient is awake,  alert and oriented in all 4 spheres. His immediate and remote memory, attention, language skills and fund of knowledge are appropriate. There is no evidence of aphasia, agnosia, apraxia or anomia. Speech is clear with normal prosody and enunciation. Thought process is linear. Mood is normal and affect is normal.  Cranial nerves II - XII are as described above under HEENT exam. In addition: shoulder shrug is normal with equal shoulder height noted. Motor exam: Normal bulk, strength and tone is noted. There is no drift, tremor or rebound.  I really cannot say that he is weak but he may have mild hip flexor weakness bilaterally.  Other than that, no obvious foot drop, no grip strength weakness, no focal or generalized atrophy.  On Romberg testing, he does have mild sway and feels insecure.  Reflexes are diminished throughout.  Toes are equivocal bilaterally.  Fine motor skills with finger taps, hand movements, rapid alternating patting is unremarkable, foot agility and foot taps also.  Cerebellar testing with finger-to-nose testing is unremarkable but he does have slight insecurity with heel-to-shin bilaterally.   Sensory exam: intact to light touch, pinprick, vibration, temperature sense in the upper and lower extremities with the exception of slight decrease in pinprick sensation in both heels.  He may have had mild decrease in pinprick sensation in the left middle finger and palm area.   Gait, station and balance: He stands without difficulty.  Posture is normal.  He walks without problems, he can stand on his toes and walk on his heels but he does have insecurity and difficulty with tandem walk.    Assessment and Plan:  In summary, Boone R Frenkel is a very pleasant 29 y.o.-year old male with an underlying medical history of asthma and allergies, who presents for evaluation of an unusual constellation of symptoms including paresthesias affecting both lower extremities, weakness affecting both lower  extremities but also some numbness affecting the distal upper extremities.  He has had something akin to a Lhermitte sign, symptoms started about 3 weeks ago.  He did have a fall but did not injure his head or neck at the time but does report an injury to the head and neck area when he was doing handstand push-ups some 6 or 7 months ago.  Exam shows some decrease in strength in the proximal lower extremities and some decrease in sensation, also  reflexes are overall diminished appearing.  He does not have any symptoms in the face, no visual symptoms, no headaches, all of which is reassuring.  He does have prescription eyeglasses and is due for his eye exam.  He is encouraged to seek a formal eye examination.  I would like to investigate his symptomatology further with additional testing from my end of things.  He recently had x-rays of the entire spine through your office.  He also had blood work through his primary care office.  I would like to add some more blood tests today including ANA, CRP, ESR and CK level.  Furthermore, I would like to proceed with a brain and cervical spine MRI with and without contrast.  Differential diagnosis does include the possibility of an underlying demyelinating disease, injury to the spinal spinal cord, herniated disc.  I talked to the patient at length about his symptoms, his presentation, my findings.  We will proceed with diagnostic evaluation and take it from there.  He is currently on light duty as understand and I would also favor that for now he will maintain light duty until we have more information about the nature of his symptoms and presentation.  I plan to see him back after testing.  We will keep him posted as to his results by phone call and make further arrangements after that.  I answered all his questions today and he was in agreement. Thank you very much for allowing me to participate in the care of this nice patient. If I can be of any further assistance to you  please do not hesitate to call me at [phone].  Sincerely,   Star Age, MD, PhD

## 2019-10-19 NOTE — Patient Instructions (Signed)
You do have mild weakness in the lower extremities particularly in the hip flexor area.  You have some decrease in sensation in both feet in the heel areas.  You have some issues with your balance on examination.  Your strength in the upper body is good.  I think we need to investigate your symptoms with scans including a brain MRI with and without contrast as well as a neck MRI with and without contrast.  I would like to do some blood work today.  We will try to expedite your scans so to get better information and where to go from here.  We will keep you posted as to your test results by phone call.

## 2019-10-19 NOTE — Telephone Encounter (Signed)
[employer] Auth: [id number] (exp. 10/19/19 to 04/15/20). I emailed Rinaldo Cloud the manager at [facility] to see if they can get him in as soon as possible.

## 2019-10-21 ENCOUNTER — Ambulatory Visit
Admission: RE | Admit: 2019-10-21 | Discharge: 2019-10-21 | Disposition: A | Discharge status: Home / Self Care | Payer: BC Managed Care – PPO | Source: Ambulatory Visit | Attending: Neurology | Admitting: Neurology

## 2019-10-21 DIAGNOSIS — R292 Abnormal reflex: Secondary | ICD-10-CM

## 2019-10-21 DIAGNOSIS — R29818 Other symptoms and signs involving the nervous system: Secondary | ICD-10-CM

## 2019-10-21 DIAGNOSIS — R278 Other lack of coordination: Secondary | ICD-10-CM

## 2019-10-21 DIAGNOSIS — R202 Paresthesia of skin: Secondary | ICD-10-CM

## 2019-10-21 DIAGNOSIS — R29898 Other symptoms and signs involving the musculoskeletal system: Secondary | ICD-10-CM

## 2019-10-21 DIAGNOSIS — G959 Disease of spinal cord, unspecified: Secondary | ICD-10-CM

## 2019-10-21 LAB — C-REACTIVE PROTEIN: CRP: <1 mg/L (ref 0–10)

## 2019-10-21 LAB — CK: Total CK: 174 U/L (ref 49–439)

## 2019-10-21 LAB — SEDIMENTATION RATE: Sed Rate: 12 mm/hr (ref 0–15)

## 2019-10-21 LAB — RHEUMATOID FACTOR: Rheumatoid fact SerPl-aCnc: <10 IU/mL (ref 0.0–13.9)

## 2019-10-21 LAB — ANA W/REFLEX: Anti Nuclear Antibody (ANA): NEGATIVE

## 2019-10-21 MED ORDER — GADOBENATE DIMEGLUMINE 529 MG/ML IV SOLN
17.0000 mL | Freq: Once | INTRAVENOUS | Status: AC | PRN
  Administered 2019-10-21: 17 mL via INTRAVENOUS

## 2019-10-24 ENCOUNTER — Telehealth: Payer: Self-pay | Admitting: Neurology

## 2019-10-24 DIAGNOSIS — R202 Paresthesia of skin: Secondary | ICD-10-CM

## 2019-10-24 DIAGNOSIS — R29898 Other symptoms and signs involving the musculoskeletal system: Secondary | ICD-10-CM

## 2019-10-24 DIAGNOSIS — R292 Abnormal reflex: Secondary | ICD-10-CM

## 2019-10-24 NOTE — Telephone Encounter (Signed)
Returned pt's call see telephone note from 10/24/2019.

## 2019-10-24 NOTE — Telephone Encounter (Signed)
I called the pt and we reviewed his results.  Pt verbalized understanding and is agreeable to pursuing EMG/NCS.  Pt is scheduled for 11/09/2019 and has been put on cancel list in case a sooner appt open up.

## 2019-10-24 NOTE — Telephone Encounter (Signed)
Please call patient and advise him that his labs were fine.  His MRI of the neck and MRI of the brain with and without contrast were also reported as normal.  These are reassuring results of course.  I would like to add 1 more test which is an electrical nerve and muscle test called EMG and nerve conduction velocity testing.  If he is okay, I will order this test and I also recommend that he be checked for Lyme disease by his primary care physician or PA.  Please advise him to make a FU appt with PCP to look for other causes of his symptoms, including thyroid disease, vitamin B12 deficiency and check for Lyme disease. Let me know, if he is okay with pursuing the EMG/NCV.

## 2019-10-24 NOTE — Telephone Encounter (Signed)
While scheduling NCS/EMG for pt, he stated that he would like a call back from the nurse regarding some questions he had about his MRI. Please advise at [phone].

## 2019-11-01 ENCOUNTER — Telehealth: Payer: Self-pay

## 2019-11-01 NOTE — Telephone Encounter (Signed)
Patient called and wanted to discuss why a NCV/EMG was scheduled, and if he could have documentation of the reason for the test. I advised him that I would reach out to you to discuss with him the reason for the testing. He can be reached at [phone]. Thank you.

## 2019-11-01 NOTE — Telephone Encounter (Signed)
I called the pt back and we discussed his questions.  Pt wanted to know if a referral for the test was needed? I advised since this was a study we could complete in our office referral was not needed.

## 2019-11-09 ENCOUNTER — Ambulatory Visit (INDEPENDENT_AMBULATORY_CARE_PROVIDER_SITE_OTHER): Payer: Self-pay | Admitting: Neurology

## 2019-11-09 ENCOUNTER — Encounter: Payer: Self-pay | Admitting: Neurology

## 2019-11-09 ENCOUNTER — Ambulatory Visit (INDEPENDENT_AMBULATORY_CARE_PROVIDER_SITE_OTHER): Payer: BC Managed Care – PPO | Admitting: Neurology

## 2019-11-09 ENCOUNTER — Ambulatory Visit: Payer: BC Managed Care – PPO | Admitting: Neurology

## 2019-11-09 ENCOUNTER — Telehealth: Payer: Self-pay | Admitting: Neurology

## 2019-11-09 VITALS — BP 133/88 | HR 78 | Ht 69.0 in | Wt 183.0 lb

## 2019-11-09 DIAGNOSIS — G6181 Chronic inflammatory demyelinating polyneuritis: Secondary | ICD-10-CM | POA: Insufficient documentation

## 2019-11-09 DIAGNOSIS — R29898 Other symptoms and signs involving the musculoskeletal system: Secondary | ICD-10-CM | POA: Diagnosis not present

## 2019-11-09 DIAGNOSIS — R202 Paresthesia of skin: Secondary | ICD-10-CM | POA: Insufficient documentation

## 2019-11-09 DIAGNOSIS — G6289 Other specified polyneuropathies: Secondary | ICD-10-CM | POA: Diagnosis not present

## 2019-11-09 NOTE — Progress Notes (Signed)
No chief complaint on file.   HISTORICAL  Andre Cole is a 29 year old male, seen in request by my colleague Dr. Brigitte Pulse for evaluation of gradual onset bilateral upper and lower extremity paresthesia, gait abnormality, electrodiagnostic study  I reviewed and summarized the referring note. 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 MRI of cervical spine, no significant abnormality  Lab in 2021, normal ESR, C-reactive protein, ANA, rheumatoid factor, CPK,  EMG nerve conduction study today November 09, 2019, showed evidence of findings consistent with CIDP, there is evidence of  significantly prolonged distal latency, and F-wave latency on all the motor nerves tested.  There is also evidence of right tibial motor temporal dispersion, patchy area of sensory loss.  EMG nerve conduction study showed mild decreased recruitment, no evidence of active denervation.  REVIEW OF SYSTEMS: Full 14 system review of systems performed and notable only for as above All other review of systems were negative.  ALLERGIES: No Known Allergies  HOME MEDICATIONS: No current  outpatient medications on file.   No current facility-administered medications for this visit.    PAST MEDICAL HISTORY: Past Medical History:  Diagnosis Date  . Allergy   . Asthma     PAST SURGICAL HISTORY: No past surgical history on file.  FAMILY HISTORY: No family history on file.  SOCIAL HISTORY: Social History   Socioeconomic History  . Marital status: Single    Spouse name: Not on file  . Number of children: Not on file  . Years of education: Not on file  . Highest education level: Not on file  Occupational History  . Not on file  Tobacco Use  . Smoking status: Former Smoker    Types: Cigars    Quit date: 02/10/2014    Years since quitting: 5.7  . Smokeless tobacco: Never Used  . Tobacco comment: no marjuina or cigars or cig since 2016 but vaps  Vaping Use  . Vaping Use: Every day  Substance and Sexual Activity  . Alcohol use: Yes    Alcohol/week: 1.0 standard drink    Types: 1 Standard drinks or equivalent per week    Comment: 2x/week - 2-4 liquior drinks a week  . Drug use: No  . Sexual activity: Yes    Partners: Female  Other Topics Concern  . 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 Difficulty of Paying Living Expenses: Not on  file  Food Insecurity:   . Worried About Charity fundraiser in the Last Year: Not on file  . Ran Out of Food in the Last Year: Not on file  Transportation Needs:   . Lack of Transportation (Medical): Not on file  . Lack of Transportation (Non-Medical): Not on file  Physical Activity:   . Days of Exercise per Week: Not on file  . Minutes of Exercise per Session: Not on file  Stress:   . Feeling of Stress : Not on file  Social Connections:   .  Frequency of Communication with Friends and Family: Not on file  . Frequency of Social Gatherings with Friends and Family: Not on file  . Attends Religious Services: Not on file  . Active Member of Clubs or Organizations: Not on file  . Attends Archivist Meetings: Not on file  . Marital Status: Not on file  Intimate Partner Violence:   . Fear of Current or Ex-Partner: Not on file  . Emotionally Abused: Not on file  . Physically Abused: Not on file  . Sexually Abused: Not on file     PHYSICAL EXAM   There were no vitals filed for this visit. Not recorded     There is no height or weight on file to calculate BMI.  PHYSICAL EXAMNIATION:  Gen: NAD, conversant, well nourised, well groomed                     Cardiovascular: Regular rate rhythm, no peripheral edema, warm, nontender. Eyes: Conjunctivae clear without exudates or hemorrhage Neck: Supple, no carotid bruits. Pulmonary: Clear to auscultation bilaterally   NEUROLOGICAL EXAM:  MENTAL STATUS: Speech:    Speech is normal; fluent and spontaneous with normal comprehension.  Cognition:     Orientation to time, place and person     Normal recent and remote memory     Normal Attention span and concentration     Normal Language, naming, repeating,spontaneous speech     Fund of knowledge   CRANIAL NERVES: CN II: Visual fields are full to confrontation. Pupils are round equal and briskly reactive to light. CN III, IV, VI: extraocular movement are normal. No ptosis. CN V: Facial sensation is intact to light touch CN VII: Face is symmetric with normal eye closure  CN VIII: Hearing is normal to causal conversation. CN IX, X: Phonation is normal. CN XI: Head turning and shoulder shrug are intact  MOTOR: Normal muscle tone, bulk, proximal muscle strengths, mild to moderate bilateral toe flexion extension weakness,  REFLEXES: Areflexia  SENSORY: Decrease the fingertip vibratory sensation, preserved toe  vibratory sensation, but decreased toe proprioception, mild length dependent decreased light touch, pinprick,  COORDINATION: There is no trunk or limb dysmetria noted.  GAIT/STANCE: Wide-based, mildly unsteady gait, mild difficulty standing up on heels, positive Romberg signs,   DIAGNOSTIC DATA (LABS, IMAGING, TESTING) - I reviewed patient records, labs, notes, testing and imaging myself where available.   ASSESSMENT AND PLAN  Andre Cole is a 29 y.o. male   Chronic inflammatory demyelinating polyradiculoneuropathy  Supported by gradually onset progressive clinical course since May 2021, distal weakness, areflexia, loss of toes proprioception, wide-based unsteady gait with positive Romberg signs,  EMG nerve conduction study also support a diagnosis of chronic demyelinating polyradiculoneuropathy, as evidenced by significantly prolonged distal latency, F-wave latency of the ulnar motor nerves tested, temporal dispersion at right tibial motor response, patchy area of sensory loss, there was no axonal loss  on EMG study,  Fluoroscopy guided lumbar puncture  Laboratory evaluations  Will treat with IVIG 2 g/kg as loading dose, (divided into 4 days) followed by 1 g/kg maintenance dose every 3 weeks, for a total of 6 treatment,  Will return to clinic in 2 months   Marcial Pacas, M.D. Ph.D.  [facility] [address] [place], [place] [address] Ph: [phone] Fax: [phone]  CC:  Scifres, Earlie Server, PA-C [address] [place],  [place] [address]  Scifres, Earlie Server, [place]

## 2019-11-09 NOTE — Progress Notes (Signed)
EMG report is under procedure tab. 

## 2019-11-09 NOTE — Telephone Encounter (Signed)
Patient is going for Lp 11/10/2019 7:15 am . Patient is aware of all details . I talked to Marcelino Duster - RN and Dr.Yan about Details .

## 2019-11-10 ENCOUNTER — Telehealth: Payer: Self-pay | Admitting: Neurology

## 2019-11-10 ENCOUNTER — Telehealth: Payer: Self-pay | Admitting: *Deleted

## 2019-11-10 ENCOUNTER — Ambulatory Visit
Admission: RE | Admit: 2019-11-10 | Discharge: 2019-11-10 | Disposition: A | Discharge status: Home / Self Care | Payer: BC Managed Care – PPO | Source: Ambulatory Visit | Attending: Neurology | Admitting: Neurology

## 2019-11-10 ENCOUNTER — Other Ambulatory Visit: Payer: Self-pay

## 2019-11-10 DIAGNOSIS — R202 Paresthesia of skin: Secondary | ICD-10-CM

## 2019-11-10 DIAGNOSIS — G6289 Other specified polyneuropathies: Secondary | ICD-10-CM

## 2019-11-10 DIAGNOSIS — R29898 Other symptoms and signs involving the musculoskeletal system: Secondary | ICD-10-CM

## 2019-11-10 DIAGNOSIS — G6181 Chronic inflammatory demyelinating polyneuritis: Secondary | ICD-10-CM

## 2019-11-10 MED ORDER — TRAZODONE HCL 50 MG PO TABS: 50.0000 mg | ORAL_TABLET | Freq: Every day | ORAL | 5 refills | Status: DC

## 2019-11-10 NOTE — Procedures (Signed)
Full Name: Andre Cole Gender: Male MRN #: [id number] Date of Birth: 01/18/1991    Visit Date: 11/09/2019 09:55 Age: 29 Years Examining Physician: Levert Feinstein, MD  Referring Physician: Huston Foley, MD Height: 5 feet 9 inch History: 29 year old male, with history of gradual onset bilateral lower extremity sensory loss since May 2021, gradual onset gait abnormality  Summary of the test: Nerve conduction study: Left median, ulnar sensory responses were absent.  Left superficial peroneal, sural, radial sensory responses were normal.  Bilateral tibial, left peroneal, bilateral ulnar, left median motor response showed significantly prolonged distal latency, with variable degree loss of CMAP amplitude.  There is significantly prolonged F-wave latency on all the motor nerves tested.  There is also evidence of temporal dispersion at right tibial motor response, with moderately decreased conduction velocity.  Electromyography: Selected needle examination was performed upper, lower extremity muscles, left cervical and lumbosacral paraspinal muscles.  There was no significant abnormalities noted.  Conclusion: This is an abnormal study.  There is electrodiagnostic evidence of chronic demyelinating polyradiculoneuropathy.  There is no evidence of axonal loss.    ------------------------------- Levert Feinstein M.D. PhD  [facility] [address] [place], [place] [address] Tel: [phone] Fax: [phone]  Verbal informed consent was obtained from the patient, patient was informed of potential risk of procedure, including bruising, bleeding, hematoma formation, infection, muscle weakness, muscle pain, numbness, among others.         MNC    Nerve / Sites Muscle Latency Ref. Amplitude Ref. Rel Amp Segments Distance Velocity Ref. Area    ms ms mV mV %  cm m/s m/s mVms  L Median - APB     Wrist APB 8.7 ?4.4 4.1 ?4.0 100 Wrist - APB 7   27.7     Upper arm APB 13.8  4.1   98.9 Upper arm - Wrist 25 49 ?49 24.8  L Ulnar - ADM     Wrist ADM 7.1 ?3.3 4.9 ?6.0 100 Wrist - ADM 7   28.4     B.Elbow ADM 11.7  4.5  92.8 B.Elbow - Wrist 23 50 ?49 23.3     A.Elbow ADM 13.8  4.5  100 A.Elbow - B.Elbow 10 49 ?49 22.9         A.Elbow - Wrist      R Ulnar - ADM     Wrist ADM 7.1 ?3.3 5.5 ?6.0 100 Wrist - ADM 7   29.9     B.Elbow ADM 11.2  4.2  76.7 B.Elbow - Wrist 23 57 ?49 19.9     A.Elbow ADM 13.1  4.3  102 A.Elbow - B.Elbow 10 53 ?49 20.0         A.Elbow - Wrist      L Peroneal - EDB     Ankle EDB 14.3 ?6.5 1.7 ?2.0 100 Ankle - EDB 9   8.4     Fib head EDB 20.7  1.7  103 Fib head - Ankle 29 45 ?44 8.5     Pop fossa EDB 23.0  1.8  100 Pop fossa - Fib head 10 44 ?44 7.2         Pop fossa - Ankle      L Tibial - AH     Ankle AH 18.4 ?5.8 0.4 ?4.0 100 Ankle - AH 9   4.6     Pop fossa AH 28.7  0.2  53.5 Pop fossa - Ankle 38 37 ?41 4.4  R Tibial - AH     Ankle AH 19.8 ?5.8 1.4 ?4.0 100 Ankle - AH 9   12.8     Pop fossa AH 32.8  1.1  77.8 Pop fossa - Ankle 38 29 ?41 8.5                    SNC    Nerve / Sites Rec. Site Peak Lat Ref.  Amp Ref. Segments Distance    ms ms V V  cm  L Radial - Anatomical snuff box (Forearm)     Forearm Wrist 2.9 ?2.9 23 ?15 Forearm - Wrist 10  L Sural - Ankle (Calf)     Calf Ankle 4.3 ?4.4 21 ?6 Calf - Ankle 14  L Superficial peroneal - Ankle     Lat leg Ankle 4.1 ?4.4 16 ?6 Lat leg - Ankle 14  L Median - Orthodromic (Dig II, Mid palm)     Dig II Wrist NR ?3.4 NR ?10 Dig II - Wrist 13  L Ulnar - Orthodromic, (Dig V, Mid palm)     Dig V Wrist NR ?3.1 NR ?5 Dig V - Wrist 29               F  Wave    Nerve F Lat Ref.   ms ms  L Tibial - AH 86.6 ?56.0  L Ulnar - ADM 40.4 ?32.0  R Tibial - AH 89.5 ?56.0  R Ulnar - ADM 41.8 ?32.0             EMG Summary Table    Spontaneous MUAP Recruitment  Muscle IA Fib PSW Fasc Other Amp Dur. Poly Pattern  L. Tibialis anterior Normal None None None _______ Normal Normal Normal Normal  L.  Tibialis posterior Normal None None None _______ Normal Normal Normal Normal  L. Peroneus longus Normal None None None _______ Normal Normal Normal Normal  L. Gastrocnemius (Medial head) Normal None None None _______ Normal Normal Normal Normal  L. Vastus lateralis Normal None None None _______ Normal Normal Normal Normal  L. Abductor hallucis Normal None None None _______ Normal Normal Normal Reduced  L. First dorsal interosseous Normal None None None _______ Normal Normal Normal Normal  L. Pronator teres Normal None None None _______ Normal Normal Normal Normal  L. Biceps brachii Normal None None None _______ Normal Normal Normal Normal  L. Deltoid Normal None None None _______ Normal Normal Normal Normal  L. Triceps brachii Normal None None None _______ Normal Normal Normal Normal  L. Cervical paraspinals Normal None None None _______ Normal Normal Normal Normal

## 2019-11-10 NOTE — Telephone Encounter (Signed)
The patient verbalized understanding of his LP results. He is agreeable to move forward with IVIG treatement. MD orders and records provided to [employer] to initiate the PA process. The patient is aware to expect a call from them once the treatment has been approved.

## 2019-11-10 NOTE — Telephone Encounter (Signed)
Please call patient, CSF study showed significantly elevated total protein 168, with WBC of 1, above findings confirm the diagnosis of chronic demyelinating polyradiculoneuropathy,  Radiologist also described difficult fluoroscopy guided lumbar puncture, with opening pressure of only 3 cmH2O, only 5 cc of spinal fluid was collected for testing,  We will initiate IVIG preauthorization,  Also ordered MRI of lumbar with without contrast to rule out lumbar pathology

## 2019-11-10 NOTE — Telephone Encounter (Signed)
Trazodone 50mg  qhs for sleep

## 2019-11-10 NOTE — Telephone Encounter (Signed)
I spoke to the patient who reports that he has not been sleeping well over the last month. He has tried antihistamines and melatonin up to 20mg . Neither has helped. He is contributing it to muscle twitching and anxiety related to his current medical condition. Per vo by Dr. , he may taking trazodone 50mg , one tablet at bedtime. He is agreeable to this plan. The prescription has been sent to the pharmacy.

## 2019-11-10 NOTE — Discharge Instructions (Signed)

## 2019-11-12 NOTE — Progress Notes (Signed)
Unsuccessful LP

## 2019-11-14 ENCOUNTER — Telehealth: Payer: Self-pay | Admitting: Neurology

## 2019-11-14 NOTE — Telephone Encounter (Signed)
Pt called and LVM asking for the RN to call back when she is available. Please advise.

## 2019-11-14 NOTE — Telephone Encounter (Signed)
The patient would like a phone call to update him on the status of his IVIG approval. This request has been provided to [employer].

## 2019-11-14 NOTE — Telephone Encounter (Signed)
[employer] TCYE:[id number] (exp. 11/10/19 to 05/07/20).  I spoke with the patient to schedule his MRI. He states that if it is not absolutely necessary then he would like to wait because bills are starting to come in that are high.

## 2019-11-14 NOTE — Telephone Encounter (Signed)
I spoke to the patient. He is anxious to get started on his IVIG therapy. He has already spoken to Liane in [place] who has provided him with an update. They are working on getting his therapy approved with his insurance plan. Unfortunately, there is nothing more we can do to speed this process up. He is aware to expect a call when it is ready to schedule. He was just seen 11/09/19.

## 2019-11-14 NOTE — Telephone Encounter (Signed)
Pt is calling to report he is ready to begin IVIG treatment, please call pt to discuss

## 2019-11-16 ENCOUNTER — Telehealth: Payer: Self-pay | Admitting: Neurology

## 2019-11-16 DIAGNOSIS — R29898 Other symptoms and signs involving the musculoskeletal system: Secondary | ICD-10-CM

## 2019-11-16 DIAGNOSIS — G6181 Chronic inflammatory demyelinating polyneuritis: Secondary | ICD-10-CM

## 2019-11-16 DIAGNOSIS — M6289 Other specified disorders of muscle: Secondary | ICD-10-CM

## 2019-11-16 MED ORDER — PREDNISONE 10 MG PO TABS: ORAL_TABLET | ORAL | 2 refills | Status: DC

## 2019-11-16 NOTE — Telephone Encounter (Signed)
Pt and his mother(not on DPR) called, message from [place].RN was read to pt. Pt states Dr Terrace Arabia and Marcelino Duster, RN dont realize how much he is worsening with great difficulty in walking.  Pt has not been able to work in almost 2 months and they have no idea as to how long it will take for him to recover.  Pt  Made suggestion of Dr Terrace Arabia doing a peer to peer with insurance company for the possibilty of things moving faster.  Pt agreed to be connected to be connected to the [facility] dept.

## 2019-11-16 NOTE — Telephone Encounter (Addendum)
He had an extensive conversation with Dr.Yan. He is agreeable to start Prednisone. He understands that [employer] is diligently working on getting his IVIG approved. He also understands repeated calls to [employer] and [employer] will unfortunately not speed up the process. He knows that he will be contacted to schedule his infusion once it is approved. In the meantime, he will hopefully gain improvement with the steroid treatment.   He is already established at [facility] and would like a dry needling order sent to them. Per vo by Dr. Terrace Arabia, ok to place this referral.

## 2019-11-16 NOTE — Addendum Note (Signed)
Addended by: Lindell Spar C on: 11/16/2019 04:20 PM   Modules accepted: Orders

## 2019-11-16 NOTE — Telephone Encounter (Signed)
Per Liane in [employer], the prior authorization request was submitted as urgent. Decision pending. No peer-to-peer is available unless the treatment is denied.

## 2019-11-16 NOTE — Telephone Encounter (Signed)
I have called patient, he is in process for IVIG, I have called in prednisone  Prednisone 10mg   6 tabs every morning x 2weeks 5 tabs every morning x 2weeks 4 tabs every morning x 2 weeks 3 tabs every morning x 2 weeks. 2 tabs every morning.

## 2019-11-17 ENCOUNTER — Telehealth: Payer: Self-pay | Admitting: Neurology

## 2019-11-17 ENCOUNTER — Encounter: Payer: BC Managed Care – PPO | Admitting: Diagnostic Neuroimaging

## 2019-11-17 LAB — CBC WITH DIFFERENTIAL/PLATELET
Basophils Absolute: 0.1 10*3/uL (ref 0.0–0.2)
Basos: 1 %
EOS (ABSOLUTE): 0.2 10*3/uL (ref 0.0–0.4)
Eos: 3 %
Hematocrit: 46.9 % (ref 37.5–51.0)
Hemoglobin: 15.4 g/dL (ref 13.0–17.7)
Immature Grans (Abs): 0 10*3/uL (ref 0.0–0.1)
Immature Granulocytes: 0 %
Lymphocytes Absolute: 1.9 10*3/uL (ref 0.7–3.1)
Lymphs: 34 %
MCH: 27.1 pg (ref 26.6–33.0)
MCHC: 32.8 g/dL (ref 31.5–35.7)
MCV: 82 fL (ref 79–97)
Monocytes Absolute: 0.3 10*3/uL (ref 0.1–0.9)
Monocytes: 5 %
Neutrophils Absolute: 3.1 10*3/uL (ref 1.4–7.0)
Neutrophils: 57 %
Platelets: 303 10*3/uL (ref 150–450)
RBC: 5.69 x10E6/uL (ref 4.14–5.80)
RDW: 13.5 % (ref 11.6–15.4)
WBC: 5.5 10*3/uL (ref 3.4–10.8)

## 2019-11-17 LAB — COMPREHENSIVE METABOLIC PANEL
ALT: 17 IU/L (ref 0–44)
AST: 18 IU/L (ref 0–40)
Albumin/Globulin Ratio: 1.6 (ref 1.2–2.2)
Albumin: 4.6 g/dL (ref 4.1–5.2)
Alkaline Phosphatase: 73 IU/L (ref 44–121)
BUN/Creatinine Ratio: 16 (ref 9–20)
BUN: 14 mg/dL (ref 6–20)
Bilirubin Total: 0.4 mg/dL (ref 0.0–1.2)
CO2: 28 mmol/L (ref 20–29)
Calcium: 10.1 mg/dL (ref 8.7–10.2)
Chloride: 99 mmol/L (ref 96–106)
Creatinine, Ser: 0.85 mg/dL (ref 0.76–1.27)
GFR calc Af Amer: 136 mL/min/{1.73_m2} (ref >59)
GFR calc non Af Amer: 118 mL/min/{1.73_m2} (ref >59)
Globulin, Total: 2.8 g/dL (ref 1.5–4.5)
Glucose: 91 mg/dL (ref 65–99)
Potassium: 4.4 mmol/L (ref 3.5–5.2)
Sodium: 140 mmol/L (ref 134–144)
Total Protein: 7.4 g/dL (ref 6.0–8.5)

## 2019-11-17 LAB — THYROID PANEL WITH TSH
Free Thyroxine Index: 1.9 (ref 1.2–4.9)
T3 Uptake Ratio: 28 % (ref 24–39)
T4, Total: 6.9 ug/dL (ref 4.5–12.0)
TSH: 1.42 u[IU]/mL (ref 0.450–4.500)

## 2019-11-17 LAB — MULTIPLE MYELOMA PANEL, SERUM
Albumin SerPl Elph-Mcnc: 4.3 g/dL (ref 2.9–4.4)
Albumin/Glob SerPl: 1.4 (ref 0.7–1.7)
Alpha 1: 0.2 g/dL (ref 0.0–0.4)
Alpha2 Glob SerPl Elph-Mcnc: 0.6 g/dL (ref 0.4–1.0)
B-Globulin SerPl Elph-Mcnc: 0.9 g/dL (ref 0.7–1.3)
Gamma Glob SerPl Elph-Mcnc: 1.4 g/dL (ref 0.4–1.8)
Globulin, Total: 3.1 g/dL (ref 2.2–3.9)
IgA/Immunoglobulin A, Serum: 171 mg/dL (ref 90–386)
IgG (Immunoglobin G), Serum: 1400 mg/dL (ref 603–1613)
IgM (Immunoglobulin M), Srm: 100 mg/dL (ref 20–172)

## 2019-11-17 LAB — HEPATITIS PANEL, ACUTE
Hep A IgM: NEGATIVE
Hep B C IgM: NEGATIVE
Hep C Virus Ab: <0.1 s/co ratio (ref 0.0–0.9)
Hepatitis B Surface Ag: NEGATIVE

## 2019-11-17 LAB — VITAMIN D 25 HYDROXY (VIT D DEFICIENCY, FRACTURES): Vit D, 25-Hydroxy: 27.7 ng/mL — ABNORMAL LOW (ref 30.0–100.0)

## 2019-11-17 LAB — C-REACTIVE PROTEIN: CRP: <1 mg/L (ref 0–10)

## 2019-11-17 LAB — HIV ANTIBODY (ROUTINE TESTING W REFLEX): HIV Screen 4th Generation wRfx: NONREACTIVE

## 2019-11-17 LAB — FOLATE: Folate: 10.3 ng/mL (ref >3.0)

## 2019-11-17 LAB — ANA W/REFLEX IF POSITIVE: Anti Nuclear Antibody (ANA): NEGATIVE

## 2019-11-17 LAB — SEDIMENTATION RATE: Sed Rate: 13 mm/hr (ref 0–15)

## 2019-11-17 NOTE — Telephone Encounter (Signed)
I returned the call to patient. He has decided against starting the Prednisone and would like to wait for the IVIG to be approved.

## 2019-11-17 NOTE — Telephone Encounter (Signed)
After performing the EMG and nerve conduction velocity testing and speaking with me, Dr. Terrace Arabia had kindly agreed to take over this patient's care.  I appreciate her help and expertise in the care of this patient.  He is in the process of starting IVIG.  He had a lumbar puncture done recently.  He will be starting prednisone.  Nothing further needed at this time.

## 2019-11-17 NOTE — Telephone Encounter (Signed)
Pt called, did not pick up the predniSONE (DELTASONE) 10 MG tablet. Want to start infusion first, not the steroids. Would like a call from the nurse

## 2019-11-17 NOTE — Telephone Encounter (Addendum)
Per vo by Dr. Terrace Arabia, he is okay to proceed with the vaccine. I called him back and notified him of this information.

## 2019-11-17 NOTE — Telephone Encounter (Signed)
Pt is asking if him getting his 2nd round of the Covid-19 vaccine tomorrow will affect his IVIG treatment, please call.

## 2019-12-08 ENCOUNTER — Telehealth: Payer: Self-pay | Admitting: Neurology

## 2019-12-08 ENCOUNTER — Encounter: Payer: Self-pay | Admitting: *Deleted

## 2019-12-08 NOTE — Telephone Encounter (Signed)
This does not sound like a drug reaction. I would ask him to see is primary care to examine the rash thanks.

## 2019-12-08 NOTE — Telephone Encounter (Signed)
Pt called, having a reaction to the infusion treatment. Have a rash in the palm of my hands. Would like a call from the nurse.

## 2019-12-08 NOTE — Telephone Encounter (Signed)
Sent my chart with MD reply.  

## 2019-12-08 NOTE — Telephone Encounter (Signed)
Called patient who stated he received loading dose of IVIG 2 weeks ago. Two days ago he got rash on inside of his hands. He has history of eczema. Hands have been abnormally dry.  He has no new meds, asked if this is side effect of IVIG, does he need to buy a cream? I advised Dr Terrace Arabia is out of office, will send to Dr Lucia Gaskins and let him know. Patient verbalized understanding, appreciation.

## 2019-12-09 LAB — GRAM STAIN
MICRO NUMBER:: 11015311
SPECIMEN QUALITY:: ADEQUATE

## 2019-12-09 LAB — CSF CELL COUNT WITH DIFFERENTIAL
RBC Count, CSF: 18 cells/uL — ABNORMAL HIGH
WBC, CSF: 1 cells/uL (ref 0–5)

## 2019-12-09 LAB — GLUCOSE, CSF: Glucose, CSF: 59 mg/dL (ref 40–80)

## 2019-12-09 LAB — FUNGUS CULTURE W SMEAR
CULTURE:: NO GROWTH
MICRO NUMBER:: 11015312
SMEAR:: NONE SEEN
SPECIMEN QUALITY:: ADEQUATE

## 2019-12-09 LAB — VDRL, CSF: VDRL Quant, CSF: NONREACTIVE

## 2019-12-09 LAB — PROTEIN, CSF: Total Protein, CSF: 168 mg/dL — ABNORMAL HIGH (ref 15–45)

## 2019-12-26 ENCOUNTER — Telehealth: Payer: Self-pay | Admitting: Neurology

## 2019-12-26 MED ORDER — PREDNISONE 10 MG PO TABS: ORAL_TABLET | ORAL | 2 refills | Status: DC

## 2019-12-26 NOTE — Telephone Encounter (Addendum)
Called pt back to further discuss. He reports balance issues/weakness in arms/legs that started last Wednesday, loss of appetite ( started last Tuesday). Last IVIG infusion 12/19/19. Next infusion per pt: 01/09/20. Has f/u 01/04/20 with Dr. Terrace Arabia. He reports eyes also turned red post infusion. Started Tuesday of last week and resolved today. Overall, he felt he was better after 1st IVIG treatment but now feels back to square one prior to treatment. Advised I will speak with Dr. Terrace Arabia and call back with recommendation.  Per Dr. Terrace Arabia, she would like him to get 2 full treatments of IVIG before determining if it is helping. Can also offer for him to take oral steroids with IVIG. She can further discuss this with him at upcoming appt as well.  Spoke with Liane, RN in the infusion suite. He had loading doses of IVIG in October. Had first full dose 12/19/19 and 12/20/19. Second full dose scheduled for 01/11/20 at 8:30am and 01/12/20 at 7:30am.   I called pt back. Relayed Dr. Zannie Cove recommendation. He is aware she wants him to try two full rounds of IVIG prior to deciding if it is effective. He is agreeable try oral steroids in the meantime as well. I e-scribed rx again (one on file said print). Rx printed again/directions too long. I called [facility]. They still had rx on file from October that patient did not pick up. They will get this ready for the patient. I also informed him of infusion date/times since he verbally told me wrong initially. He verbalized understanding.

## 2019-12-26 NOTE — Addendum Note (Signed)
Addended by: Arther Abbott on: 12/26/2019 03:51 PM   Modules accepted: Orders

## 2019-12-26 NOTE — Telephone Encounter (Signed)
Pt. requests to speak with RN. He states progress is deteriorating rapidly.

## 2019-12-27 DIAGNOSIS — R4589 Other symptoms and signs involving emotional state: Secondary | ICD-10-CM | POA: Insufficient documentation

## 2019-12-27 DIAGNOSIS — I10 Essential (primary) hypertension: Secondary | ICD-10-CM | POA: Insufficient documentation

## 2019-12-27 DIAGNOSIS — F418 Other specified anxiety disorders: Secondary | ICD-10-CM | POA: Insufficient documentation

## 2020-01-04 ENCOUNTER — Ambulatory Visit: Payer: Self-pay | Admitting: Neurology

## 2020-01-04 ENCOUNTER — Encounter: Payer: Self-pay | Admitting: Neurology

## 2020-01-04 ENCOUNTER — Ambulatory Visit: Payer: BC Managed Care – PPO | Admitting: Neurology

## 2020-01-04 VITALS — BP 128/81 | HR 103 | Ht 69.0 in | Wt 179.0 lb

## 2020-01-04 DIAGNOSIS — G6181 Chronic inflammatory demyelinating polyneuritis: Secondary | ICD-10-CM | POA: Diagnosis not present

## 2020-01-04 DIAGNOSIS — R269 Unspecified abnormalities of gait and mobility: Secondary | ICD-10-CM | POA: Diagnosis not present

## 2020-01-04 MED ORDER — PREDNISONE 10 MG PO TABS
50.0000 mg | ORAL_TABLET | Freq: Every day | ORAL | 6 refills | Status: DC
Start: 2020-01-04 — End: 2020-06-06

## 2020-01-04 NOTE — Progress Notes (Signed)
Chief Complaint  Patient presents with  . CIDP    Reports temporary relief of symptoms w/ two rounds of IVIG. He has concerns about the number of side effects he experienced after the infusions. Last week, he had worsening weakness and balance so he was seen at the ED at [facility] [place] on 12/26/19. They started him on Prednisone 28m, five tablets each day. Prednisone has made a marked improvement for him. He is able to ambulate easier and feels stronger. He also had decreased sodium and his hctz has been discontinued. States his blood pressures have been good.  . Return to work    He would like to discuss getting a letter to return to work for [name].     HISTORICAL  Andre R RIsenhoweris a 29year old male, seen in request by my colleague Dr. ABrigitte Pulsefor evaluation of gradual onset bilateral upper and lower extremity paresthesia, gait abnormality, electrodiagnostic study  I reviewed and summarized the referring note. 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 MRI of cervical spine, no significant abnormality  Lab in 2021, normal ESR, C-reactive protein, ANA, rheumatoid  factor, CPK,  EMG nerve conduction study today November 09, 2019, showed evidence of findings consistent with CIDP, there is evidence of  significantly prolonged distal latency, and F-wave latency on all the motor nerves tested.  There is also evidence of right tibial motor temporal dispersion, patchy area of sensory loss.  EMG nerve conduction study showed mild decreased recruitment, no evidence of active denervation.  UPDATE Jan 04 2020: CSF study on November 10, 2019 showed total protein of 168, confirming the diagnosis of CIDP,  Laboratory evaluation showed no treatable etiology, vitamin D was 28, normal CBC, hemoglobin of 15.4, normal negative protein electrophoresis, ANA, hepatitis panel, ESR, thyroid panel, HIV folic acid, C-reactive protein  He was offered steroid treatment initially, but he refused worried about the side effect, eventually started IVIG treatment in early October 2021, reported moderate improvement following loading dose, but after receiving second treatment November 1 and 2, he complains of increased weakness, bloodshot eye, gait abnormality, to the point of using walker  [facility] was started on prednisone 50 mg since December 27, 2019, a day later, he noticed significant improvement, he reported now he is back to 80% of baseline, designed to go back to work on [name], he denies significant side effect from steroid, was also offered antibiotic treatment every other day for unknown reasons,  Laboratory evaluation in November 2021, sodium 129, repeat 134, normal CBC, UA showed protein 30 mg per DL, moderate blood, RBC was 8,  REVIEW OF SYSTEMS: Full 14 system review of systems performed and notable only for as above All other review of systems were negative.  ALLERGIES: No Known Allergies  HOME MEDICATIONS: Current Outpatient Medications  Medication Sig Dispense Refill  . Multiple Vitamin (MULTIVITAMIN) tablet Take 1 tablet  by mouth daily.    . predniSONE (DELTASONE) 10 MG tablet Take 50 mg by mouth daily with breakfast. Started on 12/26/19 for 30 days.    . traZODone (DESYREL) 50 MG tablet Take 1 tablet (50 mg total) by mouth at bedtime. 30 tablet 5  . TURMERIC PO Take 1 tablet by mouth daily.     No current facility-administered medications for this visit.    PAST MEDICAL HISTORY: Past Medical History:  Diagnosis Date  . Allergy   . Asthma     PAST SURGICAL HISTORY: History reviewed. No pertinent surgical history.  FAMILY HISTORY: History reviewed. No pertinent family history.  SOCIAL HISTORY: Social History   Socioeconomic History  . Marital status: Single    Spouse name: Not on file  . Number of children: Not on file  . Years of education: Not on file  . Highest education level: Not on file  Occupational History  . Not on file  Tobacco Use  . Smoking status: Former Smoker    Types: Cigars    Quit date: 02/10/2014    Years since quitting: 5.9  . Smokeless tobacco: Never Used  . Tobacco comment: no marjuina or cigars or cig since 2016 but vaps  Vaping Use  . Vaping Use: Every day  Substance and Sexual Activity  . Alcohol use: Yes    Alcohol/week: 1.0 standard drink    Types: 1 Standard drinks or equivalent per week    Comment: 2x/week - 2-4 liquior drinks a week  . Drug use: No  . Sexual activity: Yes    Partners: Female  Other Topics Concern  . 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 Difficulty of Paying Living Expenses: Not on file  Food Insecurity:   . Worried About Charity fundraiser in the Last Year: Not on file  . Ran Out of Food in the Last Year: Not on file  Transportation Needs:   . Lack of  Transportation (Medical): Not on file  . Lack of Transportation (Non-Medical): Not on file  Physical Activity:   . Days of Exercise per Week: Not on file  . Minutes of Exercise per Session: Not on file  Stress:   . Feeling of Stress : Not on file  Social Connections:   . Frequency of Communication with Friends and Family: Not on file  . Frequency of Social Gatherings with Friends and Family: Not on file  . Attends Religious Services: Not on file  . Active Member of Clubs or Organizations: Not on file  . Attends Archivist Meetings: Not on file  . Marital Status: Not on file  Intimate Partner Violence:   . Fear of Current or Ex-Partner: Not on file  . Emotionally Abused: Not on file  . Physically Abused: Not on file  . Sexually Abused: Not on file     PHYSICAL EXAM   Vitals:   01/04/20 1309  BP: 128/81  Pulse: (!) 103  Weight: 179 lb (  81.2 kg)  Height: '5\' 9"'  (1.753 m)   Not recorded     Body mass index is 26.43 kg/m.  PHYSICAL EXAMNIATION:  Gen: NAD, conversant, well nourised, well groomed    NEUROLOGICAL EXAM:  MENTAL STATUS: Speech/cognition: Awake alert oriented to history taking and casual conversation   CRANIAL NERVES: CN II: Visual fields are full to confrontation. Pupils are round equal and briskly reactive to light. CN III, IV, VI: extraocular movement are normal. No ptosis. CN V: Facial sensation is intact to light touch CN VII: Face is symmetric with normal eye closure  CN VIII: Hearing is normal to causal conversation. CN IX, X: Phonation is normal. CN XI: Head turning and shoulder shrug are intact  MOTOR: Mild bilateral shoulder abduction, external rotation weakness, mild bilateral toe flexion extension weakness,  REFLEXES: Areflexia  SENSORY: Decrease the fingertip vibratory sensation, preserved toe vibratory sensation, preserved toe proprioception, mild length dependent decreased light touch, pinprick to ankle  level,  COORDINATION: There is no trunk or limb dysmetria noted.  GAIT/STANCE: Wide-based, mildly unsteady gait, mild difficulty standing up on heels, positive Romberg signs,   DIAGNOSTIC DATA (LABS, IMAGING, TESTING) - I reviewed patient records, labs, notes, testing and imaging myself where available.   ASSESSMENT AND PLAN  Andre Cole is a 29 y.o. male   Chronic inflammatory demyelinating polyradiculoneuropathy  Supported by gradually onset progressive clinical course since May 2021, distal weakness, areflexia, loss of toes proprioception, wide-based unsteady gait with positive Romberg signs,  EMG nerve conduction study also support a diagnosis of chronic demyelinating polyradiculoneuropathy, as evidenced by significantly prolonged distal latency, F-wave latency of the ulnar motor nerves tested, temporal dispersion at right tibial motor response, patchy area of sensory loss, there was no axonal loss on EMG study,  Fluoroscopy guided lumbar puncture on November 10, 2019, total protein was 168,  Laboratory evaluations showed no treatable etiology, with mildly decreased vitamin D 28,  Will treat with IVIG 2 g/kg as loading dose, (divided into 4 days) followed by 1 g/kg maintenance dose every 3 weeks since October, for a total of 6 treatment, complete second treatment November 1, 2, 2021, he complains of worsening  weakness, gait abnormality, presented to [facility] emergency room, started on steroid December 27, 2019, 50 mg daily, reported significant improvement,  Continue has lumbar exercise, mild bilateral total extension weakness, areflexia, decreased vibratory sensation in toes,   Continue IVIG maintenance dose 1 g/kg, remains on prednisone 50 mg daily, likely will tapering down prednisone if he regains more recovery,   Marcial Pacas, M.D. Ph.D.  [facility] [address], [place], [place] [address] Ph: [phone] Fax: [phone]  CC:  Scifres,  Earlie Server, PA-C [address] [place],  [place] [address]  Scifres, Earlie Server, [place]

## 2020-01-24 ENCOUNTER — Encounter: Payer: Self-pay | Admitting: *Deleted

## 2020-01-24 ENCOUNTER — Telehealth: Payer: Self-pay | Admitting: Neurology

## 2020-01-24 NOTE — Telephone Encounter (Addendum)
Please advise him still keep IVIG appointment.  Prednisone tapering, he is now taking 10mg  5 tablets daily  4 tablets every morning for 4 weeks,  Then 3 tablets every morning for 4 weeks,  Then 2 tablets every morning every morning until next follow up on Mar 16 2019.  It is Ok for him take 02-25-1985

## 2020-01-24 NOTE — Addendum Note (Signed)
Addended by: Lindell Spar C on: 01/24/2020 01:43 PM   Modules accepted: Orders

## 2020-01-24 NOTE — Telephone Encounter (Signed)
Pt. is asking how much does he need to go down on for predniSONE (DELTASONE) 10 MG tablet. Please advise.

## 2020-01-24 NOTE — Telephone Encounter (Signed)
I returned the call to the patient. Reports a significant improvement in symptoms. Says he feels 90-95% normal on the right side and 80-85% normal on the left side. He denies any balance issues. His strength is better but still not fully back to baseline yet. However, he is able to lift weights and run.   He has two questions:  1) Currently taking Prednisone 10mg , five tablets each morning. He would like to start tapering down the dose.  2) Since he is back in the gym, he wants to make sure he can start using a creatine supplement again.  His next dose of IVIG is scheduled on 01/30/20.

## 2020-01-24 NOTE — Telephone Encounter (Signed)
I reviewed the plan below with him and he expressed understanding. He would also like the information sent to him through [place]. Message has been sent. He will call us back if he has any other questions.

## 2020-01-25 ENCOUNTER — Ambulatory Visit: Payer: Self-pay | Admitting: Neurology

## 2020-01-25 ENCOUNTER — Ambulatory Visit: Payer: BC Managed Care – PPO | Admitting: Neurology

## 2020-01-30 ENCOUNTER — Telehealth: Payer: Self-pay | Admitting: Neurology

## 2020-01-30 NOTE — Telephone Encounter (Signed)
Pt wants to know if he can weight train and exercise after IVIG

## 2020-01-30 NOTE — Telephone Encounter (Signed)
He wanted to know if he can exercise the same days he has his IVIG infusions. He is aware to proceed with activity as tolerated.

## 2020-01-31 ENCOUNTER — Encounter: Payer: Self-pay | Admitting: Neurology

## 2020-01-31 NOTE — Telephone Encounter (Signed)
Error

## 2020-02-08 ENCOUNTER — Ambulatory Visit: Payer: BC Managed Care – PPO | Admitting: Neurology

## 2020-02-15 ENCOUNTER — Telehealth: Payer: Self-pay | Admitting: Neurology

## 2020-02-15 NOTE — Telephone Encounter (Signed)
Pt. called to request a letter to be cleared to go back to work. He is asking for RN to call him back. Please advise.

## 2020-02-15 NOTE — Telephone Encounter (Signed)
He has a pending office visit on 02/22/20. He would like to discuss getting full clearance to return to work at that time.  His next IVIG infusions will be on 02/23/20 and 02/24/20.

## 2020-02-22 ENCOUNTER — Encounter: Payer: Self-pay | Admitting: Neurology

## 2020-02-22 ENCOUNTER — Other Ambulatory Visit: Payer: Self-pay

## 2020-02-22 ENCOUNTER — Ambulatory Visit: Payer: BC Managed Care – PPO | Admitting: Neurology

## 2020-02-22 VITALS — BP 132/81 | HR 82 | Ht 69.0 in | Wt 193.0 lb

## 2020-02-22 DIAGNOSIS — G6181 Chronic inflammatory demyelinating polyneuritis: Secondary | ICD-10-CM

## 2020-02-22 NOTE — Progress Notes (Signed)
Chief Complaint  Patient presents with  . Follow-up    Pt reports that he feels like he is still improving and would like to discuss getting full clearance to return to work.   . Other    Patient says he needs 2 notes. 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 Brigitte Pulse for evaluation of gradual onset bilateral upper and lower extremity paresthesia, gait abnormality, electrodiagnostic study  I reviewed and summarized the referring note. 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 MRI of cervical spine, no significant abnormality  Lab in 2021, normal ESR, C-reactive protein, ANA, rheumatoid factor, CPK,  EMG nerve conduction study today November 09, 2019, showed evidence of findings  consistent with CIDP, there is evidence of  significantly prolonged distal latency, and F-wave latency on all the motor nerves tested.  There is also evidence of right tibial motor temporal dispersion, patchy area of sensory loss.  EMG nerve conduction study showed mild decreased recruitment, no evidence of active denervation.  UPDATE Jan 04 2020: CSF study on November 10, 2019 showed total protein of 168, confirming the diagnosis of CIDP,  Laboratory evaluation showed no treatable etiology, vitamin D was 28, normal CBC, hemoglobin of 15.4, normal negative protein electrophoresis, ANA, hepatitis panel, ESR, thyroid panel, HIV folic acid, C-reactive protein  He was offered steroid treatment initially, but he refused worried about the side effect, eventually started IVIG treatment in early October 2021, reported moderate improvement following loading dose, but after receiving second treatment November 1 and 2, he complains of increased weakness, bloodshot eye, gait abnormality, to the point of using walker  He eventually presented to [facility] emergency room, was started on prednisone 50 mg since December 27, 2019, a day later, he noticed significant improvement, he reported now he is back to 80% of baseline, designed to go back to work on [employer] duty, he denies significant side effect from steroid, was also offered antibiotic treatment every other day for unknown reasons,  Laboratory evaluation in November 2021, sodium 129, repeat 134, normal CBC, UA showed protein 30 mg per DL, moderate blood, RBC was 8,  UPDATE Feb 22 2020: He reported 95% back to his baseline, only has mild upper extremity weakness, slight difficulty doing push-ups, no longer has gait abnormality, no significant paresthesia  Prednisone 50 mg daily was started since 12/27/2019,  taper down to 40 mg 01/24/2020, 10 mg decrement every 4 weeks IVIG loading dose 2 g/kg in October 2021, followed by 1 g/kg every 3 to 4 weeks for total of 6  treatment,  REVIEW OF SYSTEMS: Full 14 system review of systems performed and notable only for as above All other review of systems were negative.  ALLERGIES: No Known Allergies  HOME MEDICATIONS: Current Outpatient Medications  Medication Sig Dispense Refill  . Multiple Vitamin (MULTIVITAMIN) tablet Take 1 tablet by mouth daily.    . predniSONE (DELTASONE) 10 MG tablet Take 5 tablets (50 mg total) by mouth daily with breakfast. Started on 12/26/19 for 30 days. 120 tablet 6  . TURMERIC PO Take 1 tablet by mouth daily.     No current facility-administered medications for this visit.    PAST MEDICAL HISTORY: Past Medical History:  Diagnosis Date  . Allergy   . Asthma     PAST SURGICAL HISTORY: History reviewed. No pertinent surgical history.  FAMILY HISTORY: History reviewed. No pertinent family history.  SOCIAL HISTORY: Social History   Socioeconomic History  . Marital status: Single    Spouse name: Not on file  . Number of children: Not on file  . Years of education: Not on file  . Highest education level: Not on file  Occupational History  . Not on file  Tobacco Use  . Smoking status: Former Smoker    Types: Cigars    Quit date: 02/10/2014    Years since quitting: 6.0  . Smokeless tobacco: Never Used  . Tobacco comment: no marjuina or cigars or cig since 2016 but vaps  Vaping Use  . Vaping Use: Every day  Substance and Sexual Activity  . Alcohol use: Yes    Alcohol/week: 1.0 standard drink    Types: 1 Standard drinks or equivalent per week    Comment: 2x/week - 2-4 liquior drinks a week  . Drug use: No  . Sexual activity: Yes    Partners: Female  Other Topics Concern  . 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  PHYSICAL EXAMNIATION:  Gen: NAD, conversant, well nourised, well groomed    NEUROLOGICAL EXAM:  MENTAL STATUS: Speech/cognition: Awake alert oriented to history taking and casual conversation   CRANIAL NERVES: CN II: Visual fields are full to confrontation. Pupils are round equal and briskly reactive to light. CN III, IV, VI: extraocular movement are normal. No ptosis. CN V: Facial sensation is intact to light touch CN VII: Face is symmetric with normal eye closure  CN VIII: Hearing is normal to causal conversation. CN IX, X: Phonation is normal. CN XI: Head turning and shoulder  shrug are intact  MOTOR: There was no significant bilateral upper and lower extremity weakness  REFLEXES: Areflexia  SENSORY: Preserved to finger vibratory sensation, proprioception, preserved bilateral lower extremity proprioception, pinprick,  COORDINATION: There is no trunk or limb dysmetria noted.  GAIT/STANCE: Normal gait negative Romberg sign   DIAGNOSTIC DATA (LABS, IMAGING, TESTING) - I reviewed patient records, labs, notes, testing and imaging myself where available.   ASSESSMENT AND PLAN  Jasai Sorg Poplaski is a 30 y.o. male   Chronic inflammatory demyelinating polyradiculoneuropathy  Supported by gradually onset progressive clinical course since May 2021, distal weakness, areflexia, loss of toes proprioception, wide-based unsteady gait with positive Romberg signs,  EMG nerve conduction study also support a diagnosis of chronic demyelinating polyradiculoneuropathy, as  evidenced by significantly prolonged distal latency, F-wave latency of the ulnar motor nerves tested, temporal dispersion at right tibial motor response, patchy area of sensory loss, there was no axonal loss on EMG study,  Fluoroscopy guided lumbar puncture on November 10, 2019, total protein was 168,  Laboratory evaluations showed no treatable etiology, with mildly decreased vitamin D 28,  Will treat with IVIG 2 g/kg as loading dose, (divided into 4 days) followed by 1 g/kg maintenance dose every 3 weeks since October, for a total of 6 treatment,  Prednisone was started since December 27, 2019, 50 mg daily, reported significant improvement,  Now on prednisone tapering, 10 mg decrements every 4 weeks,  He reported significant improvement with combination of IVIG, and prednisone, almost returned back to normal,  Complete IVIG treatment, continue prednisone tapering,  Follow-up in 3 months  Marcial Pacas, M.D. Ph.D.  [facility] [address], [place], [place] [address] Ph: [phone] Fax: [phone]  CC:  Scifres, Earlie Server, PA-C [place],  [place] [address]  Scifres, Earlie Server, [place]

## 2020-03-15 ENCOUNTER — Ambulatory Visit: Payer: BC Managed Care – PPO | Admitting: Neurology

## 2020-05-22 ENCOUNTER — Ambulatory Visit: Payer: BC Managed Care – PPO | Admitting: Neurology

## 2020-05-22 ENCOUNTER — Telehealth: Payer: Self-pay | Admitting: *Deleted

## 2020-05-22 ENCOUNTER — Encounter: Payer: Self-pay | Admitting: Neurology

## 2020-05-22 NOTE — Telephone Encounter (Signed)
No showed follow up appointment. 

## 2020-06-04 ENCOUNTER — Telehealth: Payer: Self-pay | Admitting: Neurology

## 2020-06-04 NOTE — Telephone Encounter (Signed)
Followed for CIDP at our office. I spoke to the patient. He is concerned about having a returned of numbness in his fingertips and loss of balance.   Says he was feeling really good so he stopped his prednisone completely for two weeks. He started drinking alcohol more frequently and vaping again. Feels this combination brought on his worsening symptoms. He also missed his last follow up.  Reports restarting Prednisone at 10mg  daily. He would like to schedule a follow up for further evaluation by Dr. .   He is going to pay his no show fee tomorrow. He has been added to the schedule on 06/06/20.

## 2020-06-04 NOTE — Telephone Encounter (Signed)
Pt called needing to pay his no show fee but was not able to get ahold of someone billing Pt also stated that he is wanting to discuss his predniSONE (DELTASONE) 10 MG tablet dosage with the RN. Please advise.

## 2020-06-06 ENCOUNTER — Ambulatory Visit (INDEPENDENT_AMBULATORY_CARE_PROVIDER_SITE_OTHER): Payer: BC Managed Care – PPO | Admitting: Neurology

## 2020-06-06 ENCOUNTER — Encounter: Payer: Self-pay | Admitting: Neurology

## 2020-06-06 VITALS — BP 140/81 | HR 86 | Ht 69.0 in | Wt 184.5 lb

## 2020-06-06 DIAGNOSIS — G6181 Chronic inflammatory demyelinating polyneuritis: Secondary | ICD-10-CM

## 2020-06-06 MED ORDER — PREDNISONE 5 MG PO TABS
10.0000 mg | ORAL_TABLET | Freq: Every day | ORAL | 4 refills | Status: DC
Start: 2020-06-06 — End: 2021-03-19

## 2020-06-06 NOTE — Progress Notes (Signed)
Chief Complaint  Patient presents with  . Follow-up    He was taking Prednisone 18m daily - feeling better so he decided to stop it completely. He was off med for two weeks. During that time, he smoked cigarettes, vaped and used alcohol. He started having a return of symptoms. Tingling in fingertips and feeling off balance. He has decided to no longer smoke or drink ETOH. He has restarted Prednisone 156mdaily. His symptoms are much improved today.   ASSESSMENT AND PLAN  Andre SturdivantoWeintraubs a [age].o. male   Chronic inflammatory demyelinating polyradiculoneuropathy  Gradually onset progressive clinical course since May 2021, distal weakness, areflexia, loss of toes proprioception, wide-based unsteady gait with positive Romberg signs,  EMG nerve conduction study also support a diagnosis of chronic demyelinating polyradiculoneuropathy, as evidenced by significantly prolonged distal latency, F-wave latency of the ulnar motor nerves tested, temporal dispersion at right tibial motor response, patchy area of sensory loss, there was no axonal loss on EMG study,  Fluoroscopy guided lumbar puncture on November 10, 2019, total protein was 168,  Laboratory evaluations showed no treatable etiology, with mildly decreased vitamin D 28,  Will treat with IVIG 2 g/kg as loading dose, (divided into 4 days) followed by 1 g/kg maintenance dose every 3 weeks since October, for a total of 6 treatment,  Prednisone was started since December 27, 2019, 50 mg daily, reported significant improvement,  He reported recurrent bilateral lower extremity paresthesia, slight unsteady gait after stop prednisone from 5 mg in early April 2022, which is earlier than intended, also reported improvement of his symptoms after restarting higher dose of prednisone up to 10 mg daily, on examination, he continued to be areflexia, mild Romberg signs,  Will repeat EMG nerve conduction study, if he has significant improvement compared to  previous study that is inconsistent with his report, may consider low-dose prednisone, slower tapering, however, if there is continued significant abnormality on the repeat EMG nerve conduction study, expecting long-term prednisone treatment, may consider adding steroid sparing agent methotrexate,  Now I have suggested him keep on prednisone low-dose 5 mg 2 tablets daily until EMG nerve conduction study  DIAGNOSTIC DATA (LABS, IMAGING, TESTING) - I reviewed patient records, labs, notes, testing and imaging myself where available.  PHYSICAL EXAM   Vitals:   06/06/20 1419  BP: 140/81  Pulse: 86  Weight: 184 lb 8 oz (83.7 kg)  Height: '5\' 9"'  (1.753 m)   Not recorded     Body mass index is 27.25 kg/m.  PHYSICAL EXAMNIATION:  Gen: NAD, conversant, well nourised, well groomed    NEUROLOGICAL EXAM:  MENTAL STATUS: Speech/cognition: Awake alert oriented to history taking and casual conversation   CRANIAL NERVES: CN II: Visual fields are full to confrontation. Pupils are round equal and briskly reactive to light. CN III, IV, VI: extraocular movement are normal. No ptosis. CN V: Facial sensation is intact to light touch CN VII: Face is symmetric with normal eye closure  CN VIII: Hearing is normal to causal conversation. CN IX, X: Phonation is normal. CN XI: Head turning and shoulder shrug are intact  MOTOR: There was no significant bilateral upper and lower extremity weakness  REFLEXES: Areflexia  SENSORY: Preserved to finger vibratory sensation, proprioception, preserved bilateral lower extremity proprioception, pinprick,  COORDINATION: There is no trunk or limb dysmetria noted.  GAIT/STANCE: Normal gait, mildly positive   HISTORICAL  Andre Cole a [age]ear old male, seen in request by my colleague Dr. AtBrigitte Pulse  for evaluation of gradual onset bilateral upper and lower extremity paresthesia, gait abnormality, electrodiagnostic study  I reviewed and summarized  the referring note. 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 MRI of cervical spine, no significant abnormality  Lab in 2021, normal ESR, C-reactive protein, ANA, rheumatoid factor, CPK,  EMG nerve conduction study today November 09, 2019, showed evidence of findings consistent with CIDP, there is evidence of  significantly prolonged distal latency, and F-wave latency on all the motor nerves tested.  There is also evidence of right tibial motor temporal dispersion, patchy area of sensory loss.  EMG nerve conduction study showed mild decreased recruitment, no evidence of active denervation.  UPDATE Jan 04 2020: CSF study on November 10, 2019 showed total protein of 168, confirming the diagnosis of CIDP,  Laboratory evaluation showed no treatable etiology, vitamin D was 28, normal CBC, hemoglobin of 15.4, normal negative protein electrophoresis, ANA, hepatitis panel, ESR, thyroid panel, HIV folic acid, C-reactive protein  He was offered steroid treatment initially, but he refused worried about the side effect, eventually started IVIG treatment in early October 2021,  reported moderate improvement following loading dose, but after receiving second treatment November 1 and 2, he complains of increased weakness, bloodshot eye, gait abnormality, to the point of using walker  He eventually presented to [facility] emergency room, was started on prednisone 50 mg since December 27, 2019, a day later, he noticed significant improvement, he reported now he is back to 80% of baseline, designed to go back to work on [employer] duty, he denies significant side effect from steroid, was also offered antibiotic treatment every other day for unknown reasons,  Laboratory evaluation in November 2021, sodium 129, repeat 134, normal CBC, UA showed protein 30 mg per DL, moderate blood, RBC was 8,  UPDATE Feb 22 2020: He reported 95% back to his baseline, only has mild upper extremity weakness, slight difficulty doing push-ups, no longer has gait abnormality, no significant paresthesia  Prednisone 50 mg daily was started since 12/27/2019, taper down to 40 mg 01/24/2020, 10 mg decrement every 4 weeks IVIG loading dose 2 g/kg in October 2021, followed by 1 g/kg every 3 to 4 weeks for total of 6 treatment,  UPDATE June 07 2018: He was doing very well on tapering dose of prednisone, 5 mg daily, decided to stop prednisone abruptly for about 2 weeks in early April, about 1 week later, he noticed recurrent numbness tingling, he put himself back on higher dose of prednisone 10 mg daily since May 25, 2020, about a week later, he noticed improvement, now almost back to his baseline, he denies upper extremity symptoms, occasionally mild unsteady gait, but barely noticeable, work full-time without difficulty  REVIEW OF SYSTEMS: Full 14 system review of systems performed  and notable only for as above All other review of systems were negative.  ALLERGIES: No Known Allergies  HOME MEDICATIONS: Current Outpatient Medications  Medication Sig Dispense Refill  . Multiple Vitamin (MULTIVITAMIN) tablet Take  1 tablet by mouth daily.    . predniSONE (DELTASONE) 10 MG tablet Take 5 tablets (50 mg total) by mouth daily with breakfast. Started on 12/26/19 for 30 days. 120 tablet 6  . TURMERIC PO Take 1 tablet by mouth daily.     No current facility-administered medications for this visit.    PAST MEDICAL HISTORY: Past Medical History:  Diagnosis Date  . Allergy   . Asthma     PAST SURGICAL HISTORY: History reviewed. No pertinent surgical history.  FAMILY HISTORY: History reviewed. No pertinent family history.  SOCIAL HISTORY: Social History   Socioeconomic History  . Marital status: Single    Spouse name: Not on file  . Number of children: Not on file  . Years of education: Not on file  . Highest education level: Not on file  Occupational History  . Not on file  Tobacco Use  . Smoking status: Former Smoker    Types: Cigars    Quit date: 02/10/2014    Years since quitting: 6.3  . Smokeless tobacco: Never Used  . Tobacco comment: no marjuina or cigars or cig since 2016 but vaps  Vaping Use  . Vaping Use: Every day  Substance and Sexual Activity  . Alcohol use: Yes    Alcohol/week: 1.0 standard drink    Types: 1 Standard drinks or equivalent per week    Comment: 2x/week - 2-4 liquior drinks a week  . Drug use: No  . Sexual activity: Yes    Partners: Female  Other Topics Concern  . 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 Ph.D.  [facility] [address]  [place], [place] [address] Ph: [phone] Fax: [phone]  CC:  Scifres, Earlie Server, PA-C [place],  [place] [address]  Scifres, Earlie Server, [place]

## 2020-06-27 ENCOUNTER — Ambulatory Visit: Payer: BC Managed Care – PPO | Admitting: Neurology

## 2020-06-27 ENCOUNTER — Ambulatory Visit (INDEPENDENT_AMBULATORY_CARE_PROVIDER_SITE_OTHER): Payer: BC Managed Care – PPO | Admitting: Neurology

## 2020-06-27 DIAGNOSIS — G6181 Chronic inflammatory demyelinating polyneuritis: Secondary | ICD-10-CM

## 2020-06-27 MED ORDER — METHOTREXATE 2.5 MG PO TABS: 2.5000 mg | ORAL_TABLET | ORAL | 11 refills | Status: DC

## 2020-06-27 MED ORDER — METHOTREXATE 2.5 MG PO TABS
10.0000 mg | ORAL_TABLET | ORAL | 4 refills | Status: DC
Start: 2020-06-27 — End: 2020-11-19

## 2020-06-27 MED ORDER — FOLIC ACID 1 MG PO TABS: 1.0000 mg | ORAL_TABLET | Freq: Every day | ORAL | 3 refills | Status: DC

## 2020-06-27 NOTE — Progress Notes (Signed)
No chief complaint on file.  ASSESSMENT AND PLAN  Andre Cole is a 30 y.o. male   Chronic inflammatory demyelinating polyradiculoneuropathy  Gradually onset progressive clinical course since May 2021, distal weakness, areflexia, loss of toes proprioception, wide-based unsteady gait with positive Romberg signs,  EMG nerve conduction study also support a diagnosis of chronic demyelinating polyradiculoneuropathy, as evidenced by significantly prolonged distal latency, F-wave latency of the ulnar motor nerves tested, temporal dispersion at right tibial motor response, patchy area of sensory loss, there was no axonal loss on EMG study,  Fluoroscopy guided lumbar puncture on November 10, 2019, total protein was 168,  Laboratory evaluations showed no treatable etiology, with mildly decreased vitamin D 28,  Will treat with IVIG 2 g/kg as loading dose, (divided into 4 days) followed by 1 g/kg maintenance dose every 3 weeks since October, for a total of 6 treatment,  Prednisone was started since December 27, 2019, 50 mg daily, reported significant improvement,  He reported recurrent bilateral lower extremity paresthesia, slight unsteady gait after stop prednisone from 5 mg in early April 2022, which is earlier than intended, also reported improvement of his symptoms after restarting higher dose of prednisone up to 10 mg daily, on examination, he continued to be areflexia, mild Romberg signs,  Peaked EMG nerve conduction study on Jun 27, 2020, continue to show evidence of chronic demyelinating polyradiculoneuropathy, as evident by prolonged distal latency ulnar motor nerve tested, significantly prolonged F-wave latency, absent bilateral ulnar, left median sensory response, with well-preserved bilateral sural, and left superficial peroneal sensory response.  Electromyography showed no evidence of denervation, but there is evidence of polyphasic motor unit potential with decreased recruitment  patterns.  He has been back on prednisone 10 mg daily since April 2022, will add on methotrexate 2.5 mg 4 tablets every week, along with folic acid daily  Laboratory evaluations include anti-MAG antibody  DIAGNOSTIC DATA (LABS, IMAGING, TESTING) - I reviewed patient records, labs, notes, testing and imaging myself where available.  PHYSICAL EXAM   Vitals:   06/06/20 1419  BP: 140/81  Pulse: 86  Weight: 184 lb 8 oz (83.7 kg)  Height: _0  (1.753 m)   Not recorded     Body mass index is 27.25 kg/m.  PHYSICAL EXAMNIATION:  Gen: NAD, conversant, well nourised, well groomed    NEUROLOGICAL EXAM:  MENTAL STATUS: Speech/cognition: Awake alert oriented to history taking and casual conversation   CRANIAL NERVES: CN II: Visual fields are full to confrontation. Pupils are round equal and briskly reactive to light. CN III, IV, VI: extraocular movement are normal. No ptosis. CN V: Facial sensation is intact to light touch CN VII: Face is symmetric with normal eye closure  CN VIII: Hearing is normal to causal conversation. CN IX, X: Phonation is normal. CN XI: Head turning and shoulder shrug are intact  MOTOR: He has mild bilateral toe extension weakness,  REFLEXES: Areflexia  SENSORY: Preserved to finger vibratory sensation, proprioception, preserved bilateral lower extremity proprioception, pinprick, vibratory sensation last for 3 seconds, decreased toe proprioception  COORDINATION: There is no trunk or limb dysmetria noted.  GAIT/STANCE: Normal gait, mildly positive Romberg signs  HISTORICAL  Andre Cole is a 30 year old male, seen in request by my colleague Dr. Brigitte Pulse for evaluation of gradual onset bilateral upper and lower extremity paresthesia, gait abnormality, electrodiagnostic study  I reviewed and summarized the referring note. 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 MRI of cervical spine, no significant abnormality  Lab in 2021, normal ESR, C-reactive protein, ANA, rheumatoid factor, CPK,  EMG nerve conduction study today November 09, 2019, showed evidence of findings consistent with CIDP, there is evidence of  significantly prolonged distal latency, and F-wave latency on all the motor nerves tested.  There is also evidence of right tibial motor temporal dispersion, patchy area of sensory loss.  EMG nerve conduction study showed mild decreased recruitment, no evidence of active denervation.  UPDATE Jan 04 2020: CSF study on November 10, 2019 showed total protein of 168, confirming the diagnosis of CIDP,  Laboratory evaluation showed no treatable etiology, vitamin D was 28, normal CBC, hemoglobin of 15.4, normal negative protein electrophoresis, ANA, hepatitis panel, ESR, thyroid panel, HIV folic acid, C-reactive protein  He was offered steroid treatment initially, but he refused worried about the side effect, eventually started IVIG treatment in early October 2021, reported moderate improvement following loading dose, but after receiving second treatment November 1 and 2, he complains of increased weakness,  bloodshot eye, gait abnormality, to the point of using walker  He eventually presented to [facility] emergency room, was started on prednisone 50 mg since December 27, 2019, a day later, he noticed significant improvement, he reported now he is back to 80% of baseline, designed to go back to work on [employer] duty, he denies significant side effect from steroid, was also offered antibiotic treatment every other day for unknown reasons,  Laboratory evaluation in November 2021, sodium 129, repeat 134, normal CBC, UA showed protein 30 mg per DL, moderate blood, RBC was 8,  UPDATE Feb 22 2020: He reported 95% back to his baseline, only has mild upper extremity weakness, slight difficulty doing push-ups, no longer has gait abnormality, no significant paresthesia  Prednisone 50 mg daily was started since 12/27/2019, taper down to 40 mg 01/24/2020, 10 mg decrement every 4 weeks IVIG loading dose 2 g/kg in October 2021, followed by 1 g/kg every 3 to 4 weeks for total of 6 treatment,  UPDATE June 06 2020: He was doing very well on tapering dose of prednisone, 5 mg daily, decided to stop prednisone abruptly for about 2 weeks in early April, about 1 week later, he noticed recurrent numbness tingling, he put himself back on higher dose of prednisone 10 mg daily since May 25, 2020, about a week later, he noticed improvement, now almost back to his baseline, he denies upper extremity symptoms, occasionally mild unsteady gait, but barely noticeable, work full-time without difficulty  Update Jun 27, 2020 He return for electrodiagnostic study today, which continue to show evidence of chronic demyelinating polyradiculoneuropathy, similar to previous study.  Prior to treatment on November 09, 2019  His symptoms has improved since higher dose of prednisone 10 mg daily, but he continue complains of lack  of stamina, difficulty sleeping at nighttime, slight gait abnormality with prolonged walking  REVIEW OF SYSTEMS: Full 14  system review of systems performed and notable only for as above All other review of systems were negative.  ALLERGIES: No Known Allergies  HOME MEDICATIONS: Current Outpatient Medications  Medication Sig Dispense Refill  . Multiple Vitamin (MULTIVITAMIN) tablet Take 1 tablet by mouth daily.    . predniSONE (DELTASONE) 5 MG tablet Take 2 tablets (10 mg total) by mouth daily with breakfast. 180 tablet 4  . TURMERIC PO Take 1 tablet by mouth daily.     No current facility-administered medications for this visit.    PAST MEDICAL HISTORY: Past Medical History:  Diagnosis Date  . Allergy   . Asthma     PAST SURGICAL HISTORY: No past surgical history on file.  FAMILY HISTORY: No family history on file.  SOCIAL HISTORY: Social History   Socioeconomic History  . Marital status: Single    Spouse name: Not on file  . Number of children: Not on file  . Years of education: Not on file  . Highest education level: Not on file  Occupational History  . Not on file  Tobacco Use  . Smoking status: Former Smoker    Types: Cigars    Quit date: 02/10/2014    Years since quitting: 6.3  . Smokeless tobacco: Never Used  . Tobacco comment: no marjuina or cigars or cig since 2016 but vaps  Vaping Use  . Vaping Use: Every day  Substance and Sexual Activity  . Alcohol use: Yes    Alcohol/week: 1.0 standard drink    Types: 1 Standard drinks or equivalent per week    Comment: 2x/week - 2-4 liquior drinks a week  . Drug use: No  . Sexual activity: Yes    Partners: Female  Other Topics Concern  . 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 Ph.D.  [facility] [address], [place], [place] [address] Ph: [phone] Fax: [phone]  CC:  Scifres, Earlie Server, PA-C [address],  [place] [id number]  Scifres, Earlie Server, [place]

## 2020-06-27 NOTE — Procedures (Addendum)
Full Name: Andre Cole Gender: Male MRN #: [id number] Date of Birth: 1991-01-10    Visit Date: 06/27/2020 09:27 Age: 30 Years Examining Physician: Levert Feinstein, MD  Referring Physician: Levert Feinstein, MD History: 30 year old male, with history of CIDP, post IVIG, steroid treatment, continue has residual paresthesia, fatigue, mild gait abnormality  Summary of the test: Nerve conduction study: Other motor nerve testing, including left median, bilateral ulnar, left peroneal, bilateral tibial motor responses showed significantly prolonged distal latency, there was no significant change compared to previous study in September 2021.  F-wave latency of bilateral tibial, and ulnar motor responses were significantly prolonged.  Bilateral sural, left superficial peroneal sensory responses were normal.  Left median, bilateral ulnar sensory response was absent.  Electromyography: Selected needle examinations of left upper, lower extremity muscles, left cervical and lumbar paraspinal muscles were performed.    There was no spontaneous activity, increased insertional activity, with evidence of mildly enlarged complex motor unit potential units, with slightly decreased recruitment patterns,  Conclusion: This is an abnormal study.  There is evidence of significantly prolonged distal latency, F-wave latency and ulnar motor nerve study.  In addition, there is evidence of absent left median, bilateral ulnar sensory response, sparing sural nerves.  There is no evidence of axonal loss.  Above findings support chronic demyelinating polyradiculoneuropathy, there is no significant change compared to previous EMG nerve conduction study in September 2011   ------------------------------- Levert Feinstein M.D. PhD  [facility] [address], [address] [place], [place] [address] Tel: [phone] Fax: [phone]  Verbal informed consent was obtained from the patient, patient was informed of  potential risk of procedure, including bruising, bleeding, hematoma formation, infection, muscle weakness, muscle pain, numbness, among others.        MNC    Nerve / Sites Muscle Latency Ref. Amplitude Ref. Rel Amp Segments Distance Velocity Ref. Area    ms ms mV mV %  cm m/s m/s mVms  L Median - APB     Wrist APB 6.8 ?4.4 4.0 ?4.0 100 Wrist - APB 7   24.6     Upper arm APB 11.8  4.4  109 Upper arm - Wrist 25 50 ?49 27.2  R Ulnar - ADM     Wrist ADM 4.6 ?3.3 5.9 ?6.0 100 Wrist - ADM 7   29.2     B.Elbow ADM 8.9  5.5  92.8 B.Elbow - Wrist 22 51 ?49 26.9     A.Elbow ADM 11.0  5.7  104 A.Elbow - B.Elbow 10 49 ?49 27.6  L Ulnar - ADM     Wrist ADM 5.9 ?3.3 5.9 ?6.0 100 Wrist - ADM 7   30.9     B.Elbow ADM 10.3  5.7  97 B.Elbow - Wrist 22 51 ?49 30.7     A.Elbow ADM 12.3  5.5  96.1 A.Elbow - B.Elbow 10 49 ?49 35.1  L Peroneal - EDB     Ankle EDB 14.7 ?6.5 2.1 ?2.0 100 Ankle - EDB 9   13.1     Fib head EDB 22.4  2.2  109 Fib head - Ankle 35 45 ?44 16.1     Pop fossa EDB 24.6  2.2  97.1 Pop fossa - Fib head 10 45 ?44 11.9         Pop fossa - Ankle      R Tibial - AH     Ankle AH 16.8 ?5.8 0.2 ?4.0 100 Ankle - AH 9  2.1     Pop fossa AH 26.9  0.2  87.7 Pop fossa - Ankle 42 42 ?41 2.2  L Tibial - AH     Ankle AH 17.9 ?5.8 0.2 ?4.0 100 Ankle - AH 9   2.1     Pop fossa AH 27.5  0.1  54.3 Pop fossa - Ankle 42 44 ?41 3.1                  SNC    Nerve / Sites Rec. Site Peak Lat Ref.  Amp Ref. Segments Distance    ms ms V V  cm  R Sural - Ankle (Calf)     Calf Ankle 4.0 ?4.4 10 ?6 Calf - Ankle 14  L Sural - Ankle (Calf)     Calf Ankle 4.2 ?4.4 13 ?6 Calf - Ankle 14  L Superficial peroneal - Ankle     Lat leg Ankle 4.0 ?4.4 11 ?6 Lat leg - Ankle 14  L Median - Orthodromic (Dig II, Mid palm)     Dig II Wrist NR ?3.4 NR ?10 Dig II - Wrist 13  R Ulnar - Orthodromic, (Dig V, Mid palm)     Dig V Wrist NR ?3.1 NR ?5 Dig V - Wrist 11  L Ulnar - Orthodromic, (Dig V, Mid palm)     Dig V  Wrist NR ?3.1 NR ?5 Dig V - Wrist 57                 F  Wave    Nerve F Lat Ref.   ms ms  R Tibial - AH 94.1 ?56.0  R Ulnar - ADM 43.6 ?32.0  L Tibial - AH 100.5 ?56.0  L Ulnar - ADM 43.2 ?32.0             EMG Summary Table    Spontaneous MUAP Recruitment  Muscle IA Fib PSW Fasc Other Amp Dur. Poly Pattern  L. Tibialis anterior Normal None None None _______ Normal Normal Normal  slightly decreased  L. Tibialis posterior Normal None None None _______ Normal Normal Normal  slightly decreased  L. Peroneus longus Normal None None None _______ Normal Normal Normal  slightly decreased  L. Vastus lateralis Normal None None None _______ Normal Normal Normal  slightly decreased  L. Gastrocnemius (Medial head) Normal None None None _______ Normal Normal Normal  slightly decreased  L. Lumbar paraspinals (mid)  increased None None None _______ Normal Normal Normal Normal  L. Lumbar paraspinals (low)  increased None None None _______ Normal Normal Normal Normal  L. First dorsal interosseous Normal None None None _______ Normal Normal Normal  slightly decreased  L. Biceps brachii Normal None None None _______ Normal Normal Normal  slightly decreased  L. Deltoid Normal None None None _______ Normal Normal Normal  slightly decreased  L. Triceps brachii Normal None None None _______ Normal Normal Normal slightly decreased  L. Pronator teres Normal None None None _______ Normal Normal Normal  slightly decreased  L. Cervical paraspinals Normal None None None _______ Normal Normal Normal Normal

## 2020-06-28 LAB — COMPREHENSIVE METABOLIC PANEL
ALT: 25 IU/L (ref 0–44)
AST: 43 IU/L — ABNORMAL HIGH (ref 0–40)
Albumin/Globulin Ratio: 1.7 (ref 1.2–2.2)
Albumin: 4.2 g/dL (ref 4.1–5.2)
Alkaline Phosphatase: 64 IU/L (ref 44–121)
BUN/Creatinine Ratio: 12 (ref 9–20)
BUN: 11 mg/dL (ref 6–20)
Bilirubin Total: 0.4 mg/dL (ref 0.0–1.2)
CO2: 25 mmol/L (ref 20–29)
Calcium: 9.7 mg/dL (ref 8.7–10.2)
Chloride: 102 mmol/L (ref 96–106)
Creatinine, Ser: 0.93 mg/dL (ref 0.76–1.27)
Globulin, Total: 2.5 g/dL (ref 1.5–4.5)
Glucose: 87 mg/dL (ref 65–99)
Potassium: 3.8 mmol/L (ref 3.5–5.2)
Sodium: 140 mmol/L (ref 134–144)
Total Protein: 6.7 g/dL (ref 6.0–8.5)
eGFR: 114 mL/min/{1.73_m2} (ref >59)

## 2020-06-28 LAB — CBC WITH DIFFERENTIAL/PLATELET
Basophils Absolute: 0.1 10*3/uL (ref 0.0–0.2)
Basos: 1 %
EOS (ABSOLUTE): 0.2 10*3/uL (ref 0.0–0.4)
Eos: 2 %
Hematocrit: 41.7 % (ref 37.5–51.0)
Hemoglobin: 13.9 g/dL (ref 13.0–17.7)
Immature Grans (Abs): 0 10*3/uL (ref 0.0–0.1)
Immature Granulocytes: 0 %
Lymphocytes Absolute: 3.2 10*3/uL — ABNORMAL HIGH (ref 0.7–3.1)
Lymphs: 40 %
MCH: 27.5 pg (ref 26.6–33.0)
MCHC: 33.3 g/dL (ref 31.5–35.7)
MCV: 83 fL (ref 79–97)
Monocytes Absolute: 0.6 10*3/uL (ref 0.1–0.9)
Monocytes: 7 %
Neutrophils Absolute: 4 10*3/uL (ref 1.4–7.0)
Neutrophils: 50 %
Platelets: 270 10*3/uL (ref 150–450)
RBC: 5.05 x10E6/uL (ref 4.14–5.80)
RDW: 13.3 % (ref 11.6–15.4)
WBC: 8 10*3/uL (ref 3.4–10.8)

## 2020-06-28 LAB — VITAMIN B12: Vitamin B-12: 1014 pg/mL (ref 232–1245)

## 2020-06-28 LAB — RPR: RPR Ser Ql: NONREACTIVE

## 2020-06-28 LAB — HEPATITIS B CORE ANTIBODY, TOTAL: Hep B Core Total Ab: NEGATIVE

## 2020-06-28 LAB — HEPATITIS C ANTIBODY: Hep C Virus Ab: <0.1 s/co ratio (ref 0.0–0.9)

## 2020-06-28 LAB — HEPATITIS B SURFACE ANTIGEN: Hepatitis B Surface Ag: NEGATIVE

## 2020-06-28 LAB — HEPATITIS B SURFACE ANTIBODY,QUALITATIVE: Hep B Surface Ab, Qual: REACTIVE

## 2020-07-03 LAB — MAG INTERPRETATION REFLEXED

## 2020-07-03 LAB — MAG IGM ANTIBODIES: MAG IgM Antibodies: <900 BTU (ref 0–999)

## 2020-07-04 ENCOUNTER — Telehealth: Payer: Self-pay | Admitting: Neurology

## 2020-07-04 NOTE — Telephone Encounter (Signed)
Called patient back. He reports he feels weaker. Not able to lift like normal at the gym. He is wanting to discuss medication w/ Dr. Terrace Arabia. Wondering if he should try IVIG? Scheduled work in visit for him to see Dr. Terrace Arabia 07/05/20 at 730am. Advised him to check in about 715am. He verbalized understanding. He also reports hives on L arm. Did use numbing cream prior to tattoo. Wondering if this could be the cause.

## 2020-07-04 NOTE — Telephone Encounter (Signed)
Pt said, feeling weaker with my condition. What can be done about it? Would like a call from the nurse

## 2020-07-05 ENCOUNTER — Encounter: Payer: Self-pay | Admitting: Neurology

## 2020-07-05 ENCOUNTER — Ambulatory Visit (INDEPENDENT_AMBULATORY_CARE_PROVIDER_SITE_OTHER): Payer: BC Managed Care – PPO | Admitting: Neurology

## 2020-07-05 ENCOUNTER — Other Ambulatory Visit: Payer: Self-pay

## 2020-07-05 VITALS — BP 150/91 | HR 70 | Ht 69.0 in | Wt 185.5 lb

## 2020-07-05 DIAGNOSIS — R269 Unspecified abnormalities of gait and mobility: Secondary | ICD-10-CM | POA: Diagnosis not present

## 2020-07-05 DIAGNOSIS — G6181 Chronic inflammatory demyelinating polyneuritis: Secondary | ICD-10-CM | POA: Diagnosis not present

## 2020-07-05 NOTE — Progress Notes (Signed)
Chief Complaint  Patient presents with  . Follow-up    New room, alone. Feeling weaker, wants to discuss medications/treatment.   ASSESSMENT AND PLAN  Andre Cole is a 30 y.o. male   Chronic inflammatory demyelinating polyradiculoneuropathy  Gradually onset progressive clinical course since May 2021, distal weakness, areflexia, loss of toes proprioception, wide-based unsteady gait with positive Romberg signs,  EMG nerve conduction study also support a diagnosis of chronic demyelinating polyradiculoneuropathy, as evidenced by significantly prolonged distal latency, F-wave latency of the ulnar motor nerves tested, temporal dispersion at right tibial motor response, patchy area of sensory loss, there was no axonal loss on EMG study,  Fluoroscopy guided lumbar puncture on November 10, 2019, total protein was 168,  Laboratory evaluations showed no treatable etiology, with mildly decreased vitamin D 28,  Will treat with IVIG 2 g/kg as loading dose, (divided into 4 days) followed by 1 g/kg maintenance dose every 3 weeks since October, for a total of 6 treatment,  Prednisone was started since December 27, 2019, 50 mg daily, reported significant improvement,  He reported recurrent bilateral lower extremity paresthesia, slight unsteady gait after stop prednisone from 5 mg in early April 2022, which is earlier than intended, also reported improvement of his symptoms after restarting higher dose of prednisone up to 10 mg daily, on examination, he continued to be areflexia, mild Romberg signs,  Peaked EMG nerve conduction study on Jun 27, 2020, continue to show evidence of chronic demyelinating polyradiculoneuropathy, as evident by prolonged distal latency ulnar motor nerve tested, significantly prolonged F-wave latency, absent bilateral ulnar, left median sensory response, with well-preserved bilateral sural, and left superficial peroneal sensory response.  Electromyography showed no evidence of  denervation, but there is evidence of polyphasic motor unit potential with decreased recruitment patterns.  He has been back on prednisone 10 mg daily since April 2022, will add on methotrexate 2.5 mg 4 tablets every week, along with folic acid daily  Feeling worse for 1 week, likely correlate with his recent upper respiratory infection, keep current medications, methotrexate 10 mg weekly, prednisone 10 mg daily until next follow-up in August,   Encouraged him complete COVID-vaccine  DIAGNOSTIC DATA (LABS, IMAGING, TESTING) - I reviewed patient records, labs, notes, testing and imaging myself where available.  Jun 27, 2020, normal B12, RPR, CBC, CMP, hepatitis panel, negative anti-MAG IgM antibody   HISTORICAL  Andre Cole is a 30 year old male, seen in request by my colleague Dr. Brigitte Pulse for evaluation of gradual onset bilateral upper and lower extremity paresthesia, gait abnormality, electrodiagnostic study  I reviewed and summarized the referring note. 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 MRI of cervical spine, no significant abnormality  Lab in 2021,  normal ESR, C-reactive protein, ANA, rheumatoid factor, CPK,  EMG nerve conduction study today November 09, 2019, showed evidence of findings consistent with CIDP, there is evidence of  significantly prolonged distal latency, and F-wave latency on all the motor nerves tested.  There is also evidence of right tibial motor temporal dispersion, patchy area of sensory loss.  EMG nerve conduction study showed mild decreased recruitment, no evidence of active denervation.  UPDATE Jan 04 2020: CSF study on November 10, 2019 showed total protein of 168, confirming the diagnosis of CIDP,  Laboratory evaluation showed no treatable etiology, vitamin D was 28, normal CBC, hemoglobin of 15.4, normal negative protein electrophoresis, ANA, hepatitis panel, ESR, thyroid panel, HIV folic acid, C-reactive protein  He was offered steroid treatment initially, but he refused worried about the side effect, eventually started IVIG treatment in early October 2021, reported moderate improvement following loading dose, but after receiving second treatment November 1 and 2, he complains of increased weakness, bloodshot eye, gait abnormality, to the point of using walker  He eventually presented to [facility] emergency room, was started on prednisone 50 mg since December 27, 2019, a day later, he noticed significant improvement, he reported now he is back to 80% of baseline, designed to go back to work on [employer] duty, he denies significant side effect from steroid, was also offered antibiotic treatment every other day for unknown reasons,  Laboratory evaluation in November 2021, sodium 129, repeat 134, normal CBC, UA showed protein 30 mg per DL, moderate blood, RBC was 8,  UPDATE Feb 22 2020: He reported 95% back to his baseline, only has mild upper extremity weakness, slight difficulty doing push-ups, no longer has gait abnormality, no significant paresthesia  Prednisone 50 mg daily was started since 12/27/2019, taper down to 40  mg 01/24/2020, 10 mg decrement every 4 weeks IVIG loading dose 2 g/kg in October 2021, followed by 1 g/kg every 3 to 4 weeks for total of 6 treatment,  UPDATE June 06 2020: He was doing very well on tapering dose of prednisone, 5 mg daily, decided to stop prednisone abruptly for about 2 weeks in early April, about 1 week later, he noticed recurrent numbness tingling, he put himself back on higher dose of prednisone 10 mg daily since May 25, 2020, about a week later, he noticed improvement, now almost back to his baseline, he denies upper extremity symptoms, occasionally mild unsteady gait, but barely noticeable, work full-time without difficulty  Update Jun 27, 2020 He return for electrodiagnostic study today, which continue to show evidence of chronic demyelinating polyradiculoneuropathy, similar to previous study.  Prior to treatment on November 09, 2019  His symptoms has improved since higher dose of prednisone 10 mg daily, but he continue complains of lack of stamina, difficulty sleeping at nighttime, slight gait abnormality with prolonged walking  UPDATE Jul 05 2020: He reported a week history of increased weakness, his wife and him suffered upper respiratory infection, cough, congestion, he only get 1 shot of COVID-vaccine, I encouraged him to finish the second and the booster shot, in addition, he works from 7 PM to 7 AM, fell rolled his left ankle today, continue has mild bilateral feet paresthesia  He remains on prednisone 10 mg daily, methotrexate 10 mg weekly, tolerating it well  PHYSICAL EXAM   Vitals:   06/06/20 1419  BP: 140/81  Pulse: 86  Weight: 184 lb 8 oz (83.7 kg)  Height: '5\' 9"'  (1.753 m)   Not recorded     Body mass index is 27.25 kg/m.  PHYSICAL EXAMNIATION:  Gen: NAD, conversant, well nourised, well groomed    NEUROLOGICAL EXAM:  MENTAL STATUS: Speech/cognition: Awake alert oriented to history taking and casual conversation   CRANIAL NERVES: CN II:  Visual fields are full to confrontation. Pupils are round equal and briskly reactive to light. CN III, IV, VI: extraocular movement are normal. No ptosis. CN V: Facial sensation is intact to light touch CN VII: Face is symmetric with normal eye closure  CN VIII: Hearing is normal to causal conversation. CN IX, X: Phonation is normal. CN XI: Head turning and shoulder shrug are intact  MOTOR: He has mild bilateral toe extension weakness,  REFLEXES: Areflexia  SENSORY: Mildly decreased to finger vibratory sensation, with normal proprioception, Toe vibratory sensation last for 3 seconds, preserved toe proprioception  COORDINATION: There is no trunk or limb dysmetria noted.  GAIT/STANCE: Antalgic due to left ankle pain, able to jump on right leg for more than 10 times   REVIEW OF SYSTEMS: Full 14 system review of systems performed and notable only for as above All other review of systems were negative.  ALLERGIES: No Known Allergies  HOME MEDICATIONS: Current Outpatient Medications  Medication Sig Dispense Refill  . folic acid (FOLVITE) 1 MG tablet Take 1 tablet (1 mg total) by mouth daily. 90 tablet 3  . methotrexate (RHEUMATREX) 2.5 MG tablet Take 4 tablets (10 mg total) by mouth once a week. Caution:Chemotherapy. Protect from light. 16 tablet 4  . Multiple Vitamin (MULTIVITAMIN) tablet Take 1 tablet by mouth daily.    . predniSONE (DELTASONE) 5 MG tablet Take 2 tablets (10 mg total) by mouth daily with breakfast. 180 tablet 4  . TURMERIC PO Take 1 tablet by mouth daily.     No current facility-administered medications for this visit.    PAST MEDICAL HISTORY: Past Medical History:  Diagnosis Date  . Allergy   . Asthma     PAST SURGICAL HISTORY: No past surgical history on file.  FAMILY HISTORY: No family history on file.  SOCIAL HISTORY: Social History   Socioeconomic History  . Marital status: Single    Spouse name: Not on file  . Number of children: Not on  file  . Years of education: Not on file  . Highest education level: Not on file  Occupational History  . Not on file  Tobacco Use  . Smoking status: Former Smoker    Types: Cigars    Quit date: 02/10/2014    Years since quitting: 6.4  . Smokeless tobacco: Never Used  . Tobacco comment: no marjuina or cigars or cig since 2016 but vaps  Vaping Use  . Vaping Use: Every day  Substance and Sexual Activity  . Alcohol use: Yes    Alcohol/week: 1.0 standard drink    Types: 1 Standard drinks or equivalent per week    Comment: 2x/week - 2-4 liquior drinks a week  . Drug use: No  . Sexual activity: Yes    Partners: Female  Other Topics Concern  . 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 Ph.D.  [facility] [address] [place], [place] [address] Ph: [phone] Fax: [phone]  CC:  Scifres, Earlie Server, PA-C [place],  [place] [address]  Scifres, Earlie Server, [place]

## 2020-07-05 NOTE — Progress Notes (Signed)
Chief Complaint  Patient presents with  . Follow-up    New room, alone. Feeling weaker, wants to discuss medications/treatment.   ASSESSMENT AND PLAN  Andre Cole is a 30 y.o. male   Chronic inflammatory demyelinating polyradiculoneuropathy  Gradually onset progressive clinical course since May 2021, distal weakness, areflexia, loss of toes proprioception, wide-based unsteady gait with positive Romberg signs,  EMG nerve conduction study also support a diagnosis of chronic demyelinating polyradiculoneuropathy, as evidenced by significantly prolonged distal latency, F-wave latency of the ulnar motor nerves tested, temporal dispersion at right tibial motor response, patchy area of sensory loss, there was no axonal loss on EMG study,  Fluoroscopy guided lumbar puncture on November 10, 2019, total protein was 168,  Laboratory evaluations showed no treatable etiology, with mildly decreased vitamin D 28,  Will treat with IVIG 2 g/kg as loading dose, (divided into 4 days) followed by 1 g/kg maintenance dose every 3 weeks since October, for a total of 6 treatment,  Prednisone was started since December 27, 2019, 50 mg daily, reported significant improvement,  He reported recurrent bilateral lower extremity paresthesia, slight unsteady gait after stop prednisone from 5 mg in early April 2022, which is earlier than intended, also reported improvement of his symptoms after restarting higher dose of prednisone up to 10 mg daily, on examination, he continued to be areflexia, mild Romberg signs,  Peaked EMG nerve conduction study on Jun 27, 2020, continue to show evidence of chronic demyelinating polyradiculoneuropathy, as evident by prolonged distal latency ulnar motor nerve tested, significantly prolonged F-wave latency, absent bilateral ulnar, left median sensory response, with well-preserved bilateral sural, and left superficial peroneal sensory response.  Electromyography showed no evidence of  denervation, but there is evidence of polyphasic motor unit potential with decreased recruitment patterns.  He has been back on prednisone 10 mg daily since April 2022, will add on methotrexate 2.5 mg 4 tablets every week, along with folic acid daily    DIAGNOSTIC DATA (LABS, IMAGING, TESTING) - I reviewed patient records, labs, notes, testing and imaging myself where available.  PHYSICAL EXAM   Vitals:   06/06/20 1419  BP: 140/81  Pulse: 86  Weight: 184 lb 8 oz (83.7 kg)  Height: '5\' 9"'  (1.753 m)   Not recorded     Body mass index is 27.25 kg/m.  PHYSICAL EXAMNIATION:  Gen: NAD, conversant, well nourised, well groomed    NEUROLOGICAL EXAM:  MENTAL STATUS: Speech/cognition: Awake alert oriented to history taking and casual conversation   CRANIAL NERVES: CN II: Visual fields are full to confrontation. Pupils are round equal and briskly reactive to light. CN III, IV, VI: extraocular movement are normal. No ptosis. CN V: Facial sensation is intact to light touch CN VII: Face is symmetric with normal eye closure  CN VIII: Hearing is normal to causal conversation. CN IX, X: Phonation is normal. CN XI: Head turning and shoulder shrug are intact  MOTOR: He has mild bilateral toe extension weakness,  REFLEXES: Areflexia  SENSORY: Preserved to finger vibratory sensation, proprioception, preserved bilateral lower extremity proprioception, pinprick, vibratory sensation last for 3 seconds, decreased toe proprioception  COORDINATION: There is no trunk or limb dysmetria noted.  GAIT/STANCE: Normal gait, mildly positive Romberg signs  HISTORICAL  Andre Cole is a 30 year old male, seen in request by my colleague Dr. Brigitte Pulse for evaluation of gradual onset bilateral upper and lower extremity paresthesia, gait abnormality, electrodiagnostic study  I reviewed and summarized the referring note. 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 MRI of cervical spine, no significant abnormality  Lab in 2021, normal ESR, C-reactive protein, ANA, rheumatoid factor, CPK,  EMG nerve conduction study today November 09, 2019, showed evidence of findings consistent with CIDP, there is evidence of  significantly prolonged distal latency, and F-wave latency on all the motor nerves tested.  There is also evidence of right tibial motor temporal dispersion, patchy area of sensory loss.  EMG nerve conduction study showed mild decreased recruitment, no evidence of active denervation.  UPDATE Jan 04 2020: CSF study on November 10, 2019 showed total protein of 168, confirming the diagnosis of CIDP,  Laboratory evaluation showed no treatable etiology, vitamin D was 28, normal CBC, hemoglobin of 15.4, normal negative protein electrophoresis, ANA, hepatitis panel, ESR, thyroid panel, HIV folic acid, C-reactive protein  He was offered steroid treatment initially, but he refused worried about the side effect, eventually started IVIG treatment in early October 2021, reported moderate improvement following loading dose, but after receiving second treatment  November 1 and 2, he complains of increased weakness, bloodshot eye, gait abnormality, to the point of using walker  He eventually presented to [facility] emergency room, was started on prednisone 50 mg since December 27, 2019, a day later, he noticed significant improvement, he reported now he is back to 80% of baseline, designed to go back to work on [employer] duty, he denies significant side effect from steroid, was also offered antibiotic treatment every other day for unknown reasons,  Laboratory evaluation in November 2021, sodium 129, repeat 134, normal CBC, UA showed protein 30 mg per DL, moderate blood, RBC was 8,  UPDATE Feb 22 2020: He reported 95% back to his baseline, only has mild upper extremity weakness, slight difficulty doing push-ups, no longer has gait abnormality, no significant paresthesia  Prednisone 50 mg daily was started since 12/27/2019, taper down to 40 mg 01/24/2020, 10 mg decrement every 4 weeks IVIG loading dose 2 g/kg in October 2021, followed by 1 g/kg every 3 to 4 weeks for total of 6 treatment,  UPDATE June 06 2020: He was doing very well on tapering dose of prednisone, 5 mg daily, decided to stop prednisone abruptly for about 2 weeks in early April, about 1 week later, he noticed recurrent numbness tingling, he put himself back on higher dose of prednisone 10 mg daily since May 25, 2020, about a week later, he noticed improvement, now almost back to his baseline, he denies upper extremity symptoms, occasionally mild unsteady gait, but barely noticeable, work full-time without difficulty  Update Jun 27, 2020 He return for electrodiagnostic study today, which continue to show evidence of chronic demyelinating polyradiculoneuropathy, similar to previous study.  Prior to treatment on November 09, 2019  His symptoms has improved since  higher dose of prednisone 10 mg daily, but he continue complains of lack of stamina, difficulty sleeping at nighttime, slight gait abnormality  with prolonged walking  REVIEW OF SYSTEMS: Full 14 system review of systems performed and notable only for as above All other review of systems were negative.  ALLERGIES: No Known Allergies  HOME MEDICATIONS: Current Outpatient Medications  Medication Sig Dispense Refill  . folic acid (FOLVITE) 1 MG tablet Take 1 tablet (1 mg total) by mouth daily. 90 tablet 3  . methotrexate (RHEUMATREX) 2.5 MG tablet Take 4 tablets (10 mg total) by mouth once a week. Caution:Chemotherapy. Protect from light. 16 tablet 4  . Multiple Vitamin (MULTIVITAMIN) tablet Take 1 tablet by mouth daily.    . predniSONE (DELTASONE) 5 MG tablet Take 2 tablets (10 mg total) by mouth daily with breakfast. 180 tablet 4  . TURMERIC PO Take 1 tablet by mouth daily.     No current facility-administered medications for this visit.    PAST MEDICAL HISTORY: Past Medical History:  Diagnosis Date  . Allergy   . Asthma     PAST SURGICAL HISTORY: No past surgical history on file.  FAMILY HISTORY: No family history on file.  SOCIAL HISTORY: Social History   Socioeconomic History  . Marital status: Single    Spouse name: Not on file  . Number of children: Not on file  . Years of education: Not on file  . Highest education level: Not on file  Occupational History  . Not on file  Tobacco Use  . Smoking status: Former Smoker    Types: Cigars    Quit date: 02/10/2014    Years since quitting: 6.4  . Smokeless tobacco: Never Used  . Tobacco comment: no marjuina or cigars or cig since 2016 but vaps  Vaping Use  . Vaping Use: Every day  Substance and Sexual Activity  . Alcohol use: Yes    Alcohol/week: 1.0 standard drink    Types: 1 Standard drinks or equivalent per week    Comment: 2x/week - 2-4 liquior drinks a week  . Drug use: No  . Sexual activity: Yes    Partners: Female  Other Topics Concern  . 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 Ph.D.  [facility] [address], [place], [place] [address] Ph: [phone] Fax: [phone]  CC:  Scifres, Earlie Server, PA-C [place],  [place] [address]  Scifres, Earlie Server, [place]

## 2020-07-10 ENCOUNTER — Telehealth: Payer: Self-pay | Admitting: Neurology

## 2020-07-10 NOTE — Telephone Encounter (Signed)
Patient was just seen in the office on 07/05/20. He was just getting over a recent upper respiratory infection at that time and had reported feeling worse for that week. He is currently taking prednisone and methotrexate. Per vo by Dr. Terrace Arabia, he has only been taking methotrexate for two weeks. He should give the medication longer to work. I left him this information on his voicemail (ok per DPR). Provided our number to call for any questions or to report a further decline.

## 2020-07-10 NOTE — Telephone Encounter (Signed)
Andre Cole - Can you call Andre Cole he wanted start back up on IVIG. He said it hasn't gotten better and he felt hi best during infusions. He tried calling here all day but never got a person on the phone so he came into the lobby asking if you can call him tomorrow.  Thank you

## 2020-07-10 NOTE — Telephone Encounter (Signed)
Andre Cole - Sorry can you look at the message from earlier today and give [name] a call. I don't think I routed it properly. Thank you

## 2020-07-10 NOTE — Telephone Encounter (Addendum)
Lovenia Shuck   07/10/20 4:35 PM Note   Marcelino Duster - Can you call Brae he wanted start back up on IVIG. He said it hasn't gotten better and he felt hi best during infusions. He tried calling here all day but never got a person on the phone so he came into the lobby asking if you can call him tomorrow.  Thank you

## 2020-07-31 ENCOUNTER — Other Ambulatory Visit (INDEPENDENT_AMBULATORY_CARE_PROVIDER_SITE_OTHER): Payer: BC Managed Care – PPO

## 2020-07-31 DIAGNOSIS — R269 Unspecified abnormalities of gait and mobility: Secondary | ICD-10-CM

## 2020-07-31 DIAGNOSIS — Z0289 Encounter for other administrative examinations: Secondary | ICD-10-CM

## 2020-07-31 DIAGNOSIS — G6181 Chronic inflammatory demyelinating polyneuritis: Secondary | ICD-10-CM

## 2020-08-01 ENCOUNTER — Telehealth: Payer: Self-pay | Admitting: Neurology

## 2020-08-01 LAB — COMPREHENSIVE METABOLIC PANEL
ALT: 19 IU/L (ref 0–44)
AST: 24 IU/L (ref 0–40)
Albumin/Globulin Ratio: 1.7 (ref 1.2–2.2)
Albumin: 4.3 g/dL (ref 4.1–5.2)
Alkaline Phosphatase: 58 IU/L (ref 44–121)
BUN/Creatinine Ratio: 18 (ref 9–20)
BUN: 14 mg/dL (ref 6–20)
Bilirubin Total: 0.4 mg/dL (ref 0.0–1.2)
CO2: 24 mmol/L (ref 20–29)
Calcium: 9.6 mg/dL (ref 8.7–10.2)
Chloride: 101 mmol/L (ref 96–106)
Creatinine, Ser: 0.8 mg/dL (ref 0.76–1.27)
Globulin, Total: 2.5 g/dL (ref 1.5–4.5)
Glucose: 84 mg/dL (ref 65–99)
Potassium: 4.1 mmol/L (ref 3.5–5.2)
Sodium: 141 mmol/L (ref 134–144)
Total Protein: 6.8 g/dL (ref 6.0–8.5)
eGFR: 123 mL/min/{1.73_m2} (ref >59)

## 2020-08-01 LAB — CBC WITH DIFFERENTIAL/PLATELET
Basophils Absolute: 0.1 10*3/uL (ref 0.0–0.2)
Basos: 1 %
EOS (ABSOLUTE): 0.1 10*3/uL (ref 0.0–0.4)
Eos: 1 %
Hematocrit: 44.1 % (ref 37.5–51.0)
Hemoglobin: 14.5 g/dL (ref 13.0–17.7)
Immature Grans (Abs): 0 10*3/uL (ref 0.0–0.1)
Immature Granulocytes: 0 %
Lymphocytes Absolute: 3.7 10*3/uL — ABNORMAL HIGH (ref 0.7–3.1)
Lymphs: 41 %
MCH: 27.6 pg (ref 26.6–33.0)
MCHC: 32.9 g/dL (ref 31.5–35.7)
MCV: 84 fL (ref 79–97)
Monocytes Absolute: 0.6 10*3/uL (ref 0.1–0.9)
Monocytes: 7 %
Neutrophils Absolute: 4.6 10*3/uL (ref 1.4–7.0)
Neutrophils: 50 %
Platelets: 272 10*3/uL (ref 150–450)
RBC: 5.25 x10E6/uL (ref 4.14–5.80)
RDW: 14 % (ref 11.6–15.4)
WBC: 9 10*3/uL (ref 3.4–10.8)

## 2020-08-01 LAB — TSH: TSH: 2.86 u[IU]/mL (ref 0.450–4.500)

## 2020-08-01 LAB — CK: Total CK: 294 U/L (ref 49–439)

## 2020-08-01 NOTE — Telephone Encounter (Signed)
Please start PA for ivig 10% 2g/kg loading over 2-3 daysx1 Followed by 1g/kg every 3-4 weeksx6.    I would like to thank you for everything you have done for me. I really appreciate your work and I am very thankful for having you as my specialist.   Im reaching out to you, to do another round of IVIG. I know we are on the steroid and methotrexate and I have not regressed but have not improved. I understand the methotrexate takes time to work but I'm getting ready to leave [place] [employer] and move over too the city. 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 While I was on the IVIG and steroid I felt my best estimated 85-90%. I also understand the goal is to come off everything. I felt like I made heath choices that brought me to where I am now as in smoking tobacco on a normal basis and drinking alcohol. That is no more and my diet and exercise is great.   Please consider doing another treatment of IVIG because I really think that is what had me heading in the direction towards remission.   Once again thank you for all you have done.

## 2020-08-01 NOTE — Telephone Encounter (Addendum)
Dr. Terrace Arabia has authorized the restart of IVIG. Per vo by Dr. Terrace Arabia, she would also like for him to continue methotrexate and prednisone for now. Pt verbalized understanding of this plan.  Liane in [facility] is aware and will get the PA completed quickly. She will call to get him scheduled for his infusion, once the therapy is approved.

## 2020-08-02 ENCOUNTER — Other Ambulatory Visit (INDEPENDENT_AMBULATORY_CARE_PROVIDER_SITE_OTHER): Payer: BC Managed Care – PPO

## 2020-08-02 ENCOUNTER — Encounter: Payer: Self-pay | Admitting: *Deleted

## 2020-08-02 ENCOUNTER — Other Ambulatory Visit: Payer: Self-pay | Admitting: *Deleted

## 2020-08-02 ENCOUNTER — Other Ambulatory Visit: Payer: Self-pay

## 2020-08-02 DIAGNOSIS — G6289 Other specified polyneuropathies: Secondary | ICD-10-CM

## 2020-08-02 DIAGNOSIS — Z79899 Other long term (current) drug therapy: Secondary | ICD-10-CM

## 2020-08-02 DIAGNOSIS — Z0289 Encounter for other administrative examinations: Secondary | ICD-10-CM

## 2020-08-03 LAB — IGA: IgA/Immunoglobulin A, Serum: 142 mg/dL (ref 90–386)

## 2020-08-07 NOTE — Telephone Encounter (Signed)
Dr. Terrace Arabia has reviewed the lab result and given [facility] the okay to restart IVIG. They are working on his prior authorization and will contact patient to schedule, once approved.

## 2020-09-24 ENCOUNTER — Ambulatory Visit: Payer: BC Managed Care – PPO | Admitting: Neurology

## 2020-09-24 ENCOUNTER — Other Ambulatory Visit: Payer: Self-pay | Admitting: *Deleted

## 2020-09-24 ENCOUNTER — Telehealth: Payer: Self-pay | Admitting: Neurology

## 2020-09-24 NOTE — Telephone Encounter (Signed)
He is due for his three month follow up for CIDP. He has rescheduled his appt to 09/25/20 w/ Dr. Terrace Arabia.

## 2020-09-24 NOTE — Telephone Encounter (Signed)
Pt states since nothing has changed does he still need to keep his appointment, please call

## 2020-09-25 ENCOUNTER — Encounter: Payer: Self-pay | Admitting: Neurology

## 2020-09-25 ENCOUNTER — Ambulatory Visit: Payer: BC Managed Care – PPO | Admitting: Neurology

## 2020-09-25 ENCOUNTER — Other Ambulatory Visit: Payer: Self-pay

## 2020-09-25 VITALS — Ht 70.0 in | Wt 193.0 lb

## 2020-09-25 DIAGNOSIS — G6289 Other specified polyneuropathies: Secondary | ICD-10-CM

## 2020-09-25 NOTE — Progress Notes (Signed)
Chief Complaint  Patient presents with   Follow-up    Routine Follow up: back on IVIG New Room, alone   ASSESSMENT AND PLAN  Andre Cole is a 30 y.o. male   Chronic inflammatory demyelinating polyradiculoneuropathy  Gradually onset progressive clinical course since May 2021, distal weakness, areflexia, loss of toes proprioception, wide-based unsteady gait with positive Romberg signs,  EMG nerve conduction study also support a diagnosis of chronic demyelinating polyradiculoneuropathy, as evidenced by significantly prolonged distal latency, F-wave latency of the ulnar motor nerves tested, temporal dispersion at right tibial motor response, patchy area of sensory loss, there was no axonal loss on EMG study,  Fluoroscopy guided lumbar puncture on November 10, 2019, total protein was 168,  Laboratory evaluations showed no treatable etiology, with mildly decreased vitamin D 28,  He responded very well to IVIG, initial treatment was in October 2021   Prednisone was started since December 27, 2019, 50 mg daily, reported significant improvement,  He reported recurrent bilateral lower extremity paresthesia, slight unsteady gait after stop prednisone from 5 mg in early April 2022, which is earlier than intended, also reported improvement of his symptoms after restarting higher dose of prednisone up to 10 mg daily, on examination, he continued to be areflexia, mild Romberg signs,  Repeat EMG nerve conduction study on Jun 27, 2020, continue to show evidence of chronic demyelinating polyradiculoneuropathy,   He has been back on prednisone 10 mg daily since April 2022, will add on methotrexate 2.5 mg 4 tablets every week, along with folic acid daily  Restarted with IVIG, loading dose 2g/kg in June 2022, followed by 1 g/kg every 4 weeks,doing well, on today's examination September 25, 2020, still has mild length dependent sensory changes, areflexia, positive Romberg signs, continue current IVIG  maintenance dose, along with prednisone 10 mg daily, methotrexate 10 mg weekly   DIAGNOSTIC DATA (LABS, IMAGING, TESTING) - I reviewed patient records, labs, notes, testing and imaging myself where available. August 02, 2020, normal IgA, CMP, CPK, TSH, hepatitis B, C, RPR, B12, CBC     HISTORICAL  Andre Cole is a 30 year old male, seen in request by my colleague Dr. Brigitte Pulse for evaluation of gradual onset bilateral upper and lower extremity paresthesia, gait abnormality, electrodiagnostic study  I reviewed and summarized the referring note. 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 MRI of cervical spine, no significant abnormality  Lab in 2021, normal ESR, C-reactive protein, ANA, rheumatoid factor, CPK,  EMG nerve conduction study today November 09, 2019, showed evidence of findings consistent with CIDP, there is evidence of  significantly prolonged distal latency, and F-wave latency on all the motor nerves tested.  There is also evidence of right tibial motor temporal dispersion, patchy area of sensory loss.  EMG  nerve conduction study showed mild decreased  recruitment, no evidence of active denervation.  UPDATE Jan 04 2020: CSF study on November 10, 2019 showed total protein of 168, confirming the diagnosis of CIDP,  Laboratory evaluation showed no treatable etiology, vitamin D was 28, normal CBC, hemoglobin of 15.4, normal negative protein electrophoresis, ANA, hepatitis panel, ESR, thyroid panel, HIV folic acid, C-reactive protein  He was offered steroid treatment initially, but he refused worried about the side effect, eventually started IVIG treatment in early October 2021, reported moderate improvement following loading dose, but after receiving second treatment November 1 and 2, he complains of increased weakness, bloodshot eye, gait abnormality, to the point of using walker  He eventually presented to [facility] emergency room, was started on prednisone 50 mg since December 27, 2019, a day later, he noticed significant improvement, he reported now he is back to 80% of baseline, designed to go back to work on [employer] duty, he denies significant side effect from steroid, was also offered antibiotic treatment every other day for unknown reasons,  Laboratory evaluation in November 2021, sodium 129, repeat 134, normal CBC, UA showed protein 30 mg per DL, moderate blood, RBC was 8,  UPDATE Feb 22 2020: He reported 95% back to his baseline, only has mild upper extremity weakness, slight difficulty doing push-ups, no longer has gait abnormality, no significant paresthesia  Prednisone 50 mg daily was started since 12/27/2019, taper down to 40 mg 01/24/2020, 10 mg decrement every 4 weeks IVIG loading dose 2 g/kg in October 2021, followed by 1 g/kg every 3 to 4 weeks for total of 6 treatment,  UPDATE June 06 2020: He was doing very well on tapering dose of prednisone, 5 mg daily, decided to stop prednisone abruptly for about 2 weeks in early April, about 1 week later, he noticed recurrent numbness tingling, he put himself back on higher dose of prednisone 10  mg daily since May 25, 2020, about a week later, he noticed improvement, now almost back to his baseline, he denies upper extremity symptoms, occasionally mild unsteady gait, but barely noticeable, work full-time without difficulty  Update Jun 27, 2020 He return for electrodiagnostic study today, which continue to show evidence of chronic demyelinating polyradiculoneuropathy, similar to previous study.  Prior to treatment on November 09, 2019  His symptoms has improved since higher dose of prednisone 10 mg daily, but he continue complains of lack of stamina, difficulty sleeping at nighttime, slight gait abnormality with prolonged walking  UPDATE September 26 2019: We restarted ivig June 18 loading, then monthly,   Not at his best, feet togather, mild balance issure, had toe, tight, 80%, strength is good, sprintly in quarter, work out gym PHYSICAL EXAM   Vitals:   06/06/20 1419  BP: 140/81  Pulse: 86  Weight: 184 lb 8 oz (83.7 kg)  Height: '5\' 9"'  (1.753 m)   Not recorded     Body mass index is 27.25 kg/m.  PHYSICAL EXAMNIATION:  Gen: NAD, conversant, well nourised, well groomed    NEUROLOGICAL EXAM:  MENTAL STATUS: Speech/cognition: Awake alert oriented to history taking and casual conversation   CRANIAL NERVES: CN II: Visual fields are full to confrontation. Pupils are round equal and briskly reactive to light. CN III, IV, VI: extraocular movement are normal. No ptosis. CN V: Facial sensation is intact to light touch CN VII: Face is symmetric with normal eye closure  CN VIII: Hearing is normal to causal conversation. CN IX, X: Phonation is normal. CN XI:  Head turning and shoulder shrug are intact  MOTOR: He has slight bilateral toe extension weakness,  REFLEXES: Areflexia  SENSORY: Preserved to finger vibratory sensation, proprioception, decreased bilateral toes proprioception, length dependent decreased to pinprick, decreased toe vibratory  sensation  COORDINATION: There is no trunk or limb dysmetria noted.  GAIT/STANCE: Normal gait, mildly positive Romberg signs  REVIEW OF SYSTEMS: Full 14 system review of systems performed and notable only for as above All other review of systems were negative.  ALLERGIES: No Known Allergies  HOME MEDICATIONS: Current Outpatient Medications  Medication Sig Dispense Refill   folic acid (FOLVITE) 1 MG tablet Take 1 tablet (1 mg total) by mouth daily. 90 tablet 3   methotrexate (RHEUMATREX) 2.5 MG tablet Take 4 tablets (10 mg total) by mouth once a week. Caution:Chemotherapy. Protect from light. 16 tablet 4   Multiple Vitamin (MULTIVITAMIN) tablet Take 1 tablet by mouth daily.     predniSONE (DELTASONE) 5 MG tablet Take 2 tablets (10 mg total) by mouth daily with breakfast. 180 tablet 4   TURMERIC PO Take 1 tablet by mouth daily.     No current facility-administered medications for this visit.    PAST MEDICAL HISTORY: Past Medical History:  Diagnosis Date   Allergy    Asthma     PAST SURGICAL HISTORY: History reviewed. No pertinent surgical history.  FAMILY HISTORY: History reviewed. No pertinent family history.  SOCIAL HISTORY: Social History   Socioeconomic History   Marital status: Single    Spouse name: Not on file   Number of children: Not on file   Years of education: Not on file   Highest education level: Not on file  Occupational History   Not on file  Tobacco Use   Smoking status: Former    Types: Cigars    Quit date: 02/10/2014    Years since quitting: 6.6   Smokeless tobacco: Never   Tobacco comments:    no marjuina or cigars or cig since 2016 but vaps  Vaping Use   Vaping Use: Every day  Substance and Sexual Activity   Alcohol use: Yes    Alcohol/week: 1.0 standard drink    Types: 1 Standard drinks or equivalent per week    Comment: 2x/week - 2-4 liquior drinks a week   Drug use: No   Sexual activity: Yes    Partners: Female  Other Topics  Concern   Not on file  Social History Narrative   Lives with roommate   Right Handed   Drink a preworkout drink 2-3 x/week   Social Determinants of Health   Financial Resource Strain: Not on file  Food Insecurity: Not on file  Transportation Needs: Not on file  Physical Activity: Not on file  Stress: Not on file  Social Connections: Not on file  Intimate Partner Violence: Not on file     Marcial Pacas, M.D. Ph.D.  [facility] [address] [place], [place] [address] Ph: [phone] Fax: [phone]  CC:  Scifres, Earlie Server, PA-C [place],  [place] [address]  Scifres, Earlie Server, [place]

## 2020-10-16 ENCOUNTER — Ambulatory Visit
Admission: RE | Admit: 2020-10-16 | Discharge: 2020-10-16 | Disposition: A | Discharge status: Home / Self Care | Payer: No Typology Code available for payment source | Source: Ambulatory Visit | Attending: Nurse Practitioner | Admitting: Nurse Practitioner

## 2020-10-16 ENCOUNTER — Other Ambulatory Visit: Payer: Self-pay

## 2020-10-16 ENCOUNTER — Other Ambulatory Visit: Payer: Self-pay | Admitting: Nurse Practitioner

## 2020-10-16 DIAGNOSIS — Z021 Encounter for pre-employment examination: Secondary | ICD-10-CM

## 2020-10-31 ENCOUNTER — Telehealth: Payer: Self-pay | Admitting: Neurology

## 2020-10-31 NOTE — Telephone Encounter (Signed)
Yehuda Budd NP for the [facility] called wanting to discuss pt's condition. She states he is currently an office at [employer] however pt is wanting to take a jon at [facility]. Wanting to know if it would be safe for the pt does not want to stress the pt out. Clydie Braun requesting a call back.

## 2020-10-31 NOTE — Telephone Encounter (Addendum)
Our office is unable to return this call without a medical release form being signed by the patient. I called and left him a detailed message (ok per DPR). I notified him of the information requested and told him we would need his consent, if he would like for [place] to discuss his medical care with the person below. Also, gave our office hours.

## 2020-11-19 ENCOUNTER — Other Ambulatory Visit: Payer: Self-pay | Admitting: Neurology

## 2020-12-11 ENCOUNTER — Telehealth: Payer: Self-pay | Admitting: Neurology

## 2020-12-11 NOTE — Telephone Encounter (Signed)
[facility]) called, patient has an ankle factor; need surgery. Is patient ok for anesthesia or a block and if ok hold on  predniSONE (DELTASONE) 5 MG tablet? If Dr. Terrace Arabia have any concerns that Dr. Weber Cooks would need to be aware of. Faxing over surgical clearance form to [phone] Can form to Fax no: [phone].

## 2020-12-12 NOTE — Telephone Encounter (Signed)
Patient had left ankle fracture, bilateral malleolus ankle fracture, pending will have ankle surgery,  Presurgical clearance generated, it is okay to go through anesthesia tears of choice, hold of prednisone 10 mg daily, patient continues to respond well to IVIG

## 2020-12-12 NOTE — Telephone Encounter (Signed)
Clearance form received and placed on Dr. Zannie Cove desk to review and sign.

## 2020-12-12 NOTE — Telephone Encounter (Signed)
Signed and faxed back to [phone]. Received fax confirmation.

## 2020-12-12 NOTE — Progress Notes (Signed)
EMG report is under procedure 

## 2020-12-19 ENCOUNTER — Telehealth (HOSPITAL_COMMUNITY): Payer: Self-pay | Admitting: Orthopedic Surgery

## 2020-12-19 MED ORDER — OXYCODONE HCL 5 MG PO TABS: 5.0000 mg | ORAL_TABLET | ORAL | 0 refills | Status: AC | PRN

## 2020-12-19 MED ORDER — ONDANSETRON HCL 4 MG PO TABS: 4.0000 mg | ORAL_TABLET | Freq: Three times a day (TID) | ORAL | 0 refills | Status: AC | PRN

## 2020-12-19 MED ORDER — ACETAMINOPHEN 500 MG PO TABS: 1000.0000 mg | ORAL_TABLET | Freq: Three times a day (TID) | ORAL | 0 refills | Status: AC | PRN

## 2020-12-19 NOTE — Telephone Encounter (Signed)
Post-op meds sent

## 2021-02-15 ENCOUNTER — Other Ambulatory Visit: Payer: Self-pay | Admitting: Neurology

## 2021-02-15 ENCOUNTER — Telehealth: Payer: Self-pay | Admitting: Neurology

## 2021-02-15 NOTE — Telephone Encounter (Signed)
Pt called wanting to speak to the RN or provider about possibly getting on predniSONE (DELTASONE) 5 MG tablet again due to balance issues. Please advise.

## 2021-02-18 NOTE — Telephone Encounter (Signed)
Per vo by Dr. Terrace Arabia, it is okay for the patient to stop the medication with no tapering. He verbalized understanding.

## 2021-02-18 NOTE — Telephone Encounter (Signed)
Pt called to inform that he had ankle surgery in early November not December.

## 2021-02-18 NOTE — Telephone Encounter (Signed)
Pt called to confirm the message from Friday was sent for him.  Pt is still asking for a call back from RN so he can relay more to her.

## 2021-02-18 NOTE — Telephone Encounter (Signed)
He will have follow-up visit on March 25, 2020, likely will see patient during IVIG infusion, will not start prednisone, which can potentially affecting the healing of his fracture

## 2021-02-18 NOTE — Telephone Encounter (Signed)
Reports having left ankle surgery in December (could not remember the date). He is unsure if his gait is related to his current recovery (just got out of a boot this week). Says his last x-ray showed his bone has healed. He is still in PT.   His bigger concern is his hands feel "tight" causing him difficulty with fine motor skills. Decreased sensation in his fingertips. Similar to what he felt when he first came to [place]. He has continued getting IVIG treatment. He would like Dr. Terrace Arabia to review this additional information for consideration of restarting Prednisone.

## 2021-02-18 NOTE — Telephone Encounter (Signed)
I spoke to the patient. He has a pending appt for IVIG next week and will give [place] an update.   During this call, he admits to already restarting prednisone at 60mg  QD for the last four days. He wants to know if he should taper off.

## 2021-03-11 ENCOUNTER — Telehealth: Payer: Self-pay | Admitting: Neurology

## 2021-03-11 NOTE — Telephone Encounter (Signed)
Pt called wanting to know if he can restart the predniSONE (DELTASONE) 5 MG tablet due to his condition getting worse. He would like to discuss with RN.

## 2021-03-11 NOTE — Telephone Encounter (Addendum)
Spoke to pt, states he has an Appt with ortho 03/12/21 Advised pt that Dr Terrace Arabia would need to review those notes and wait until pt has fully recovered before starting him on another dose of prednisone. Pt has appt with dr Terrace Arabia on 03/27/21   I mychart him message to hold off prednisone until I see him at office visit.

## 2021-03-12 NOTE — Telephone Encounter (Signed)
Pt called back stating he was cleared by ortho to restart prednisone.

## 2021-03-13 ENCOUNTER — Encounter: Payer: Self-pay | Admitting: Neurology

## 2021-03-19 ENCOUNTER — Encounter: Payer: Self-pay | Admitting: Neurology

## 2021-03-19 ENCOUNTER — Ambulatory Visit (INDEPENDENT_AMBULATORY_CARE_PROVIDER_SITE_OTHER): Payer: BC Managed Care – PPO | Admitting: Neurology

## 2021-03-19 VITALS — BP 142/89 | HR 82 | Ht 70.0 in | Wt 198.5 lb

## 2021-03-19 DIAGNOSIS — G6289 Other specified polyneuropathies: Secondary | ICD-10-CM

## 2021-03-19 DIAGNOSIS — R269 Unspecified abnormalities of gait and mobility: Secondary | ICD-10-CM

## 2021-03-19 MED ORDER — PREDNISONE 10 MG PO TABS
ORAL_TABLET | ORAL | 3 refills | Status: DC
Start: 2021-03-19 — End: 2021-12-11

## 2021-03-19 NOTE — Progress Notes (Signed)
Chief Complaint  Patient presents with   Follow-up    Rm 15. Alone. 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 Pt reports since coming off of prednisone and having surgery neuropathy symptoms have gotten worse in the left leg and calf, hamstring. States symptoms are also worse in both hands.   ASSESSMENT AND PLAN  Andre Cole is a 31 y.o. male   Chronic inflammatory demyelinating polyradiculoneuropathy  Gradually onset progressive clinical course since May 2021, distal weakness, areflexia, loss of toes proprioception, wide-based unsteady gait with positive Romberg signs,  EMG nerve conduction study also support a diagnosis of chronic demyelinating polyradiculoneuropathy, as evidenced by significantly prolonged distal latency, F-wave latency of the ulnar motor nerves tested, temporal dispersion at right tibial motor response, patchy area of sensory loss, there was no axonal loss on EMG study,  Fluoroscopy guided lumbar puncture on November 10, 2019, total protein was 168,  Laboratory evaluations showed no treatable etiology, with mildly decreased vitamin D 28,  He responded very well to IVIG, initial treatment was in October 2021   Prednisone was started since December 27, 2019, 50 mg daily, reported significant improvement,  He reported recurrent bilateral lower extremity paresthesia, slight unsteady gait after stop prednisone from 5 mg in early April 2022, which is earlier than intended, also reported improvement of his symptoms after restarting higher dose of prednisone up to 10 mg daily,    Repeat EMG nerve conduction study on Jun 27, 2020, continue to show evidence of chronic demyelinating polyradiculoneuropathy, He was on prednisone 10 mg daily since April 2022, will add on methotrexate 2.5 mg 4 tablets every week, along with folic acid daily Broke his left ankle on December 08, 2020, prepared for the surgery, he stopped the prednisone and the methotrexate abruptly on December 18, 2020, began to  notice recurrent bilateral toes numbness after he is off the boot, in January 2023, also noticed bilateral finger paresthesia recently, examination showed recurrence distal weakness, sensory loss, unsteady gait  Restarted with IVIG, loading dose 2g/kg x3 in February 2023, then go back to previous maintenance dose 1 g/kg   Also prednisone 50 mg every morning for 2 weeks, 10 mg decrement every 2 weeks, maintenance dose 10 mg every day  He does not want to restart methotrexate, is planning on starting family with his wife   DIAGNOSTIC DATA (LABS, IMAGING, TESTING) - I reviewed patient records, labs, notes, testing and imaging myself where available. August 02, 2020, normal IgA, CMP, CPK, TSH, hepatitis B, C, RPR, B12, CBC     HISTORICAL  Andre Cole is a 31 year old male, seen in request by my colleague Dr. Brigitte Pulse for evaluation of gradual onset bilateral upper and lower extremity paresthesia, gait abnormality, electrodiagnostic study  I reviewed and summarized the referring note. 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 MRI of cervical spine, no significant  abnormality  Lab in 2021, normal ESR, C-reactive protein, ANA, rheumatoid factor, CPK,  EMG nerve conduction study today November 09, 2019, showed evidence of findings consistent with CIDP, there is evidence of  significantly prolonged distal latency, and F-wave latency on all the motor nerves tested.  There is also evidence of right tibial motor temporal dispersion, patchy area of sensory loss.  EMG nerve conduction study showed mild decreased recruitment, no evidence of active denervation.  UPDATE Jan 04 2020: CSF study on November 10, 2019 showed total protein of 168, confirming the diagnosis of CIDP,  Laboratory evaluation showed no treatable etiology, vitamin D was 28, normal CBC, hemoglobin of 15.4, normal negative protein electrophoresis, ANA, hepatitis panel, ESR, thyroid panel, HIV folic acid, C-reactive protein  He was offered steroid treatment initially, but he refused worried about the side effect, eventually started IVIG treatment in early October 2021, reported moderate improvement following loading dose, but after receiving second treatment November 1 and 2, he complains of increased weakness, bloodshot eye, gait abnormality, to the point of using walker  He eventually presented to [facility] emergency room, was started on prednisone 50 mg since December 27, 2019, a day later, he noticed significant improvement, he reported now he is back to 80% of baseline, designed to go back to work on [employer] duty, he denies significant side effect from steroid, was also offered antibiotic treatment every other day for unknown reasons,  Laboratory evaluation in November 2021, sodium 129, repeat 134, normal CBC, UA showed protein 30 mg per DL, moderate blood, RBC was 8,  UPDATE Feb 22 2020: He reported 95% back to his baseline, only has mild upper extremity weakness, slight difficulty doing push-ups, no longer has gait abnormality, no significant paresthesia  Prednisone 50 mg daily was started since  12/27/2019, taper down to 40 mg 01/24/2020, 10 mg decrement every 4 weeks IVIG loading dose 2 g/kg in October 2021, followed by 1 g/kg every 3 to 4 weeks for total of 6 treatment,  UPDATE June 06 2020: He was doing very well on tapering dose of prednisone, 5 mg daily, decided to stop prednisone abruptly for about 2 weeks in early April, about 1 week later, he noticed recurrent numbness tingling, he put himself back on higher dose of prednisone 10 mg daily since May 25, 2020, about a week later, he noticed improvement, now almost back to his baseline, he denies upper extremity symptoms, occasionally mild unsteady gait, but barely noticeable, work full-time without difficulty  Update Jun 27, 2020 He return for electrodiagnostic study today, which continue to show evidence of chronic demyelinating polyradiculoneuropathy, similar to previous study.  Prior to treatment on November 09, 2019  His symptoms has improved since higher dose of prednisone 10 mg daily, but he continue complains of lack of stamina, difficulty sleeping at nighttime, slight gait abnormality with prolonged walking  UPDATE September 26 2019: We restarted ivig June 18 loading, then monthly,   Not at his best, feet togather, mild balance issure, had toe, tight, 80%, strength is good, sprintly in quarter, work out gym  UPDATE Mar 19 2021: He was normal, he fell off trik, broke his ankle on Oct 29th 2022, stop prednisone and methotrexate on Nov 8th 2022, began to noticed increased bilateral foot, finger paresthesia since Jan 2023, also noticed increased unsteady gait, hand clumsiness   He does not want to restart methotrexate, planning on starting  family with his wife, he will be off work until April 16, 2021    [place]:   06/06/20 1419  BP: 140/81  Pulse: 86  Weight: 184 lb 8 oz (83.7 kg)  Height: _0  (1.753 m)   Not recorded     Body mass index is 27.25 kg/m.  PHYSICAL EXAMNIATION:  Gen: NAD,  conversant, well nourised, well groomed    NEUROLOGICAL EXAM:  MENTAL STATUS: Speech/cognition: Awake alert oriented to history taking and casual conversation   CRANIAL NERVES: CN II: Visual fields are full to confrontation. Pupils are round equal and briskly reactive to light. CN III, IV, VI: extraocular movement are normal. No ptosis. CN V: Facial sensation is intact to light touch CN VII: Face is symmetric with normal eye closure  CN VIII: Hearing is normal to causal conversation. CN IX, X: Phonation is normal. CN XI: Head turning and shoulder shrug are intact  MOTOR: He has slight bilateral toe extension/flexion weakness, mild finger abduction  REFLEXES: Areflexia  SENSORY: Absent bilateral toes proprioception, vibratory sensation, length dependent decreased to pinprick to ankle level  COORDINATION: There is no trunk or limb dysmetria noted.  GAIT/STANCE: Slightly antalgic gait, mildly positive Romberg signs  REVIEW OF SYSTEMS: Full 14 system review of systems performed and notable only for as above All other review of systems were negative.  ALLERGIES: No Known Allergies  HOME MEDICATIONS: Current Outpatient Medications  Medication Sig Dispense Refill   Multiple Vitamin (MULTIVITAMIN) tablet Take 1 tablet by mouth daily.     folic acid (FOLVITE) 1 MG tablet Take 1 tablet (1 mg total) by mouth daily. (Patient not taking: Reported on 03/19/2021) 90 tablet 3   methotrexate 2.5 MG tablet TAKE 4 TABLETS BY MOUTH ONCE A WEEK (Patient not taking: Reported on 03/19/2021) 16 tablet 4   predniSONE (DELTASONE) 5 MG tablet Take 2 tablets (10 mg total) by mouth daily with breakfast. (Patient not taking: Reported on 03/19/2021) 180 tablet 4   TURMERIC PO Take 1 tablet by mouth daily. (Patient not taking: Reported on 03/19/2021)     No current facility-administered medications for this visit.    PAST MEDICAL HISTORY: Past Medical History:  Diagnosis Date   Allergy    Asthma      PAST SURGICAL HISTORY: History reviewed. No pertinent surgical history.  FAMILY HISTORY: History reviewed. No pertinent family history.  SOCIAL HISTORY: Social History   Socioeconomic History   Marital status: Single    Spouse name: Not on file   Number of children: Not on file   Years of education: Not on file   Highest education level: Not on file  Occupational History   Not on file  Tobacco Use   Smoking status: Former    Types: Cigars    Quit date: 02/10/2014    Years since quitting: 7.1   Smokeless tobacco: Never   Tobacco comments:    no marjuina or cigars or cig since 2016 but vaps  Vaping Use   Vaping Use: Every day  Substance and Sexual Activity   Alcohol use: Yes    Alcohol/week: 1.0 standard drink    Types: 1 Standard drinks or equivalent per week    Comment: 2x/week - 2-4 liquior drinks a week   Drug use: No   Sexual activity: Yes    Partners: Female  Other Topics Concern   Not on file  Social History Narrative   Lives with roommate   Right Handed  Drink a preworkout drink 2-3 x/week   Social Determinants of Health   Financial Resource Strain: Not on file  Food Insecurity: Not on file  Transportation Needs: Not on file  Physical Activity: Not on file  Stress: Not on file  Social Connections: Not on file  Intimate Partner Violence: Not on file     Marcial Pacas, M.D. Ph.D.  [facility] [address], [place], [place] [address] Ph: [phone] Fax: [phone]  CC:  Scifres, Earlie Server, PA-C [place] [place],  [place] [address]  Scifres, Earlie Server, [place]

## 2021-03-28 ENCOUNTER — Ambulatory Visit: Payer: BC Managed Care – PPO | Admitting: Neurology

## 2021-04-18 ENCOUNTER — Ambulatory Visit (INDEPENDENT_AMBULATORY_CARE_PROVIDER_SITE_OTHER): Payer: BC Managed Care – PPO | Admitting: Neurology

## 2021-04-18 DIAGNOSIS — Z79899 Other long term (current) drug therapy: Secondary | ICD-10-CM

## 2021-04-18 DIAGNOSIS — R269 Unspecified abnormalities of gait and mobility: Secondary | ICD-10-CM | POA: Diagnosis not present

## 2021-04-18 DIAGNOSIS — G6289 Other specified polyneuropathies: Secondary | ICD-10-CM

## 2021-04-21 ENCOUNTER — Encounter: Payer: Self-pay | Admitting: Neurology

## 2021-04-21 DIAGNOSIS — Z79899 Other long term (current) drug therapy: Secondary | ICD-10-CM | POA: Insufficient documentation

## 2021-04-21 NOTE — Progress Notes (Signed)
Chief Complaint  Patient presents with   Follow-up    Rm 15.    ASSESSMENT AND PLAN  Andre Cole is a 31 y.o. male   Chronic inflammatory demyelinating polyradiculoneuropathy  Gradually onset progressive clinical course since May 2021, distal weakness, areflexia, loss of toes proprioception, wide-based unsteady gait with positive Romberg signs,  EMG nerve conduction study also support a diagnosis of chronic demyelinating polyradiculoneuropathy, as evidenced by significantly prolonged distal latency, F-wave latency of the ulnar motor nerves tested, temporal dispersion at right tibial motor response, patchy area of sensory loss, there was no axonal loss on EMG study,  Fluoroscopy guided lumbar puncture on November 10, 2019, total protein was 168,  Laboratory evaluations showed no treatable etiology, with mildly decreased vitamin D 28,  He responded very well to IVIG, initial treatment was in October 2021   Prednisone was started since December 27, 2019, 50 mg daily, reported significant improvement,  He reported recurrent bilateral lower extremity paresthesia, slight unsteady gait after stop prednisone from 5 mg in early April 2022, which is earlier than intended, also reported improvement of his symptoms after restarting higher dose of prednisone up to 10 mg daily,    Repeat EMG nerve conduction study on Jun 27, 2020, continue to show evidence of chronic demyelinating polyradiculoneuropathy, He was on prednisone 10 mg daily since April 2022, will add on methotrexate 2.5 mg 4 tablets every week, along with folic acid daily Broke his left ankle on December 08, 2020, prepared for the surgery, he stopped the prednisone and the methotrexate abruptly on December 18, 2020, began to notice recurrent bilateral toes numbness after he is off the boot, in January 2023, also noticed bilateral finger paresthesia recently, examination showed recurrence distal weakness, sensory loss, unsteady gait  He is  on tapering dose of prednisone 50 mg every morning for 2 weeks, 10 mg decrement every 2 weeks, maintenance dose 10 mg every day  He tolerated IVIG loading dose 10% Octagam 2 g/kg, but was not sure about the benefit likely response to previous IVIG treatment, hope to try 10% Panzyga,       DIAGNOSTIC DATA (LABS, IMAGING, TESTING) - I reviewed patient records, labs, notes, testing and imaging myself where available. August 02, 2020, normal IgA, CMP, CPK, TSH, hepatitis B, C, RPR, B12, CBC     HISTORICAL  Andre Cole is a 31 year old male, seen in request by my colleague Dr. Brigitte Pulse for evaluation of gradual onset bilateral upper and lower extremity paresthesia, gait abnormality, electrodiagnostic study  I reviewed and summarized the referring note. 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 MRI of cervical spine, no significant abnormality  Lab in 2021, normal ESR, C-reactive protein, ANA, rheumatoid factor, CPK,  EMG nerve conduction study today  November 09, 2019, showed evidence of findings consistent with CIDP, there is evidence of  significantly prolonged  distal latency, and F-wave latency on all the motor nerves tested.  There is also evidence of right tibial motor temporal dispersion, patchy area of sensory loss.  EMG nerve conduction study showed mild decreased recruitment, no evidence of active denervation.  UPDATE Jan 04 2020: CSF study on November 10, 2019 showed total protein of 168, confirming the diagnosis of CIDP,  Laboratory evaluation showed no treatable etiology, vitamin D was 28, normal CBC, hemoglobin of 15.4, normal negative protein electrophoresis, ANA, hepatitis panel, ESR, thyroid panel, HIV folic acid, C-reactive protein  He was offered steroid treatment initially, but he refused worried about the side effect, eventually started IVIG treatment in early October 2021, reported moderate improvement following loading dose, but after receiving second treatment November 1 and 2, he complains of increased weakness, bloodshot eye, gait abnormality, to the point of using walker  He eventually presented to [facility] room, was started on prednisone 50 mg since December 27, 2019, a day later, he noticed significant improvement, he reported now he is back to 80% of baseline, designed to go back to work on [employer] duty, he denies significant side effect from steroid, was also offered antibiotic treatment every other day for unknown reasons,  Laboratory evaluation in November 2021, sodium 129, repeat 134, normal CBC, UA showed protein 30 mg per DL, moderate blood, RBC was 8,  UPDATE Feb 22 2020: He reported 95% back to his baseline, only has mild upper extremity weakness, slight difficulty doing push-ups, no longer has gait abnormality, no significant paresthesia  Prednisone 50 mg daily was started since 12/27/2019, taper down to 40 mg 01/24/2020, 10 mg decrement every 4 weeks IVIG loading dose 2 g/kg in October 2021, followed by 1 g/kg every 3 to 4 weeks for total of 6 treatment,  UPDATE June 06 2020: He was doing very well on tapering  dose of prednisone, 5 mg daily, decided to stop prednisone abruptly for about 2 weeks in early April, about 1 week later, he noticed recurrent numbness tingling, he put himself back on higher dose of prednisone 10 mg daily since May 25, 2020, about a week later, he noticed improvement, now almost back to his baseline, he denies upper extremity symptoms, occasionally mild unsteady gait, but barely noticeable, work full-time without difficulty  Update Jun 27, 2020 He return for electrodiagnostic study today, which continue to show evidence of chronic demyelinating polyradiculoneuropathy, similar to previous study.  Prior to treatment on November 09, 2019  His symptoms has improved since higher dose of prednisone 10 mg daily, but he continue complains of lack of stamina, difficulty sleeping at nighttime, slight gait abnormality with prolonged walking  UPDATE September 26 2019: We restarted ivig June 18 loading, then monthly,   Not at his best, feet togather, mild balance issure, had toe, tight, 80%, strength is good, sprintly in quarter, work out gym  UPDATE Mar 19 2021: He was normal, he fell off trik, broke his ankle on Oct 29th 2022, stop prednisone and methotrexate on Nov 8th 2022, began to noticed increased bilateral foot, finger paresthesia since Jan 2023, also noticed increased unsteady gait, hand clumsiness   He does not want to restart methotrexate, planning on starting family with his wife, he will be off work until April 16, 2021  Update April 29, 2021: He was started on prednisone 50 mg daily since last visit on March 19, 2021, he noticed  some improvement, but was not able to bounce back to previous level prior to his ankle fracture, he also started with IVIG Octagam loading dose 2 g/kg, and then maintenance dose 1 g/kg, he did not notice any significant improvement following IVIG, wondering if other 10% such as Penzyga will work better for her condition  PHYSICAL EXAM   [place]:    06/06/20 1419  BP: 140/81  Pulse: 86  Weight: 184 lb 8 oz (83.7 kg)  Height: '5\' 9"'  (1.753 m)   Not recorded     Body mass index is 27.25 kg/m.  PHYSICAL EXAMNIATION:  Gen: NAD, conversant, well nourised, well groomed    NEUROLOGICAL EXAM:  MENTAL STATUS: Speech/cognition: Awake alert oriented to history taking and casual conversation   CRANIAL NERVES: CN II: Visual fields are full to confrontation. Pupils are round equal and briskly reactive to light. CN III, IV, VI: extraocular movement are normal. No ptosis. CN V: Facial sensation is intact to light touch CN VII: Face is symmetric with normal eye closure  CN VIII: Hearing is normal to causal conversation. CN IX, X: Phonation is normal. CN XI: Head turning and shoulder shrug are intact  MOTOR: He has slight bilateral toe extension/flexion weakness, mild finger abduction  REFLEXES: Areflexia  SENSORY: Absent bilateral toes proprioception, vibratory sensation, length dependent decreased to pinprick to ankle level  COORDINATION: There is no trunk or limb dysmetria noted.  GAIT/STANCE: Cautious gait, mildly positive Romberg signs  REVIEW OF SYSTEMS: Full 14 system review of systems performed and notable only for as above All other review of systems were negative.  ALLERGIES: No Known Allergies  HOME MEDICATIONS: Current Outpatient Medications  Medication Sig Dispense Refill   folic acid (FOLVITE) 1 MG tablet Take 1 tablet (1 mg total) by mouth daily. (Patient not taking: Reported on 03/19/2021) 90 tablet 3   Multiple Vitamin (MULTIVITAMIN) tablet Take 1 tablet by mouth daily.     predniSONE (DELTASONE) 10 MG tablet 67m daily x2 weeks, then 152mdecrement every 2 weeks, 120 tablet 3   TURMERIC PO Take 1 tablet by mouth daily. (Patient not taking: Reported on 03/19/2021)     No current facility-administered medications for this visit.    PAST MEDICAL HISTORY: Past Medical History:  Diagnosis Date   Allergy     Asthma     PAST SURGICAL HISTORY: No past surgical history on file.  FAMILY HISTORY: No family history on file.  SOCIAL HISTORY: Social History   Socioeconomic History   Marital status: Single    Spouse name: Not on file   Number of children: Not on file   Years of education: Not on file   Highest education level: Not on file  Occupational History   Not on file  Tobacco Use   Smoking status: Former    Types: Cigars    Quit date: 02/10/2014    Years since quitting: 7.1   Smokeless tobacco: Never   Tobacco comments:    no marjuina or cigars or cig since 2016 but vaps  Vaping Use   Vaping Use: Every day  Substance and Sexual Activity   Alcohol use: Yes    Alcohol/week: 1.0 standard drink    Types: 1 Standard drinks or equivalent per week    Comment: 2x/week - 2-4 liquior drinks a week   Drug use: No   Sexual activity: Yes    Partners: Female  Other Topics Concern   Not on file  Social History Narrative   Lives  with roommate   Right Handed   Drink a preworkout drink 2-3 x/week   Social Determinants of Health   Financial Resource Strain: Not on file  Food Insecurity: Not on file  Transportation Needs: Not on file  Physical Activity: Not on file  Stress: Not on file  Social Connections: Not on file  Intimate Partner Violence: Not on file     Marcial Pacas, M.D. Ph.D.  [facility] [address] [place], [place] [address] Ph: [phone] Fax: [phone]  CC:  Scifres, Earlie Server, PA-C [place],  [place] [address]  Scifres, Earlie Server, [place]

## 2021-04-22 ENCOUNTER — Telehealth: Payer: Self-pay | Admitting: Neurology

## 2021-04-22 NOTE — Telephone Encounter (Signed)
Information provided to [facility]. 

## 2021-04-22 NOTE — Telephone Encounter (Signed)
I did peer to peer review for potential change to different IVIG, Panzyga due to his reported last significant improvement using Octagam. ? ?His insurance would require him failed to IVIG before consider nonpreferred Panzyga. ? ?He may try a different 10% IVIG (not Octagam), to see how he respond ?

## 2021-05-06 ENCOUNTER — Ambulatory Visit (INDEPENDENT_AMBULATORY_CARE_PROVIDER_SITE_OTHER): Payer: BC Managed Care – PPO | Admitting: Neurology

## 2021-05-06 ENCOUNTER — Encounter: Payer: BC Managed Care – PPO | Admitting: Neurology

## 2021-05-06 DIAGNOSIS — G6289 Other specified polyneuropathies: Secondary | ICD-10-CM | POA: Diagnosis not present

## 2021-05-06 DIAGNOSIS — Z0289 Encounter for other administrative examinations: Secondary | ICD-10-CM

## 2021-05-06 DIAGNOSIS — R269 Unspecified abnormalities of gait and mobility: Secondary | ICD-10-CM

## 2021-05-06 MED ORDER — METHOTREXATE 2.5 MG PO TABS: 2.5000 mg | ORAL_TABLET | ORAL | 11 refills | Status: DC

## 2021-05-06 NOTE — Progress Notes (Signed)
? ?No chief complaint on file. ? ?ASSESSMENT AND PLAN ? ?Andre Cole is a 31 y.o. male   ?Chronic inflammatory demyelinating polyradiculoneuropathy ? Gradually onset progressive clinical course since May 2021, distal weakness, areflexia, loss of toes proprioception, wide-based unsteady gait with positive Romberg signs, ? EMG nerve conduction study also support a diagnosis of chronic demyelinating polyradiculoneuropathy, as evidenced by significantly prolonged distal latency, F-wave latency of the ulnar motor nerves tested, temporal dispersion at right tibial motor response, patchy area of sensory loss, there was no axonal loss on EMG study, ? Fluoroscopy guided lumbar puncture on November 10, 2019, total protein was 168, ? Laboratory evaluations showed no treatable etiology, with mildly decreased vitamin D 28, ? He responded very well to IVIG, initial treatment was in October 2021  ? Prednisone was started since December 27, 2019, 50 mg daily, reported significant improvement, ? He reported recurrent bilateral lower extremity paresthesia, slight unsteady gait after stop prednisone from 5 mg in early April 2022, which is earlier than intended, also reported improvement of his symptoms after restarting higher dose of prednisone up to 10 mg daily,   ? Repeat EMG nerve conduction study on Jun 27, 2020, and May 06, 2021 continue to show evidence of chronic demyelinating polyradiculoneuropathy, no significant change ?He was on prednisone 10 mg daily since April 2022,  methotrexate 2.5 mg 4 tablets every week was added on since April 4742, along with folic acid daily ?Broke his left ankle on December 08, 2020, prepared for the surgery, he stopped the prednisone and the methotrexate abruptly on December 18, 2020, began to notice recurrent bilateral toes numbness after he is off the boot, in January 2023, also noticed bilateral finger paresthesia recently, examination showed recurrence distal weakness, sensory loss,  unsteady gait ? He is on tapering dose of prednisone 50 mg every morning for 2 weeks, 10 mg decrement every 2 weeks, now on 20 mg daily, he is doing very well now, now back to presurgical level, did not notice recurrent symptoms with tapering dose of prednisone, continue taper down prednisone to 15 mg daily, ?his wife is conceived, restarted methotrexate 2.5 mg 6 tablets daily in March 5956, along with folic acid ?He reported suboptimal response to Octagam, preauthorized with different IVIG now, hope to switch to 10% Panzyga eventually ?  ?  ?DIAGNOSTIC DATA (LABS, IMAGING, TESTING) ?- I reviewed patient records, labs, notes, testing and imaging myself where available. ?August 02, 2020, normal IgA, CMP, CPK, TSH, hepatitis B, C, RPR, B12, CBC ? ? ? ? ?HISTORICAL ? ?Andre Cole is a 31 year old male, seen in request by my colleague Dr. Brigitte Pulse for evaluation of gradual onset bilateral upper and lower extremity paresthesia, gait abnormality, electrodiagnostic study ? ?I reviewed and summarized the referring note. ?He was previously healthy, start cross-country training in September 2020, without any difficulty, around May 2021, he noticed decreased sensation around the genital area, but denies significant paresthesia involving upper or lower extremity, he felt mild clumsiness, tightness of lower extremity muscle especially after his cross-country training, ? 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 ?He denies significant neck pain, no back pain, no bowel and bladder incontinence, ?I personally reviewed MRI of the brain with  and without contrast October 21, 2019, no significant abnormality. ?MRI of cervical spine, no  significant abnormality ? ?Lab in 2021, normal ESR, C-reactive protein, ANA, rheumatoid factor, CPK, ? ?EMG nerve conduction study today November 09, 2019, showed evidence of findings consistent with CIDP, there is evidence of  significantly prolonged distal latency, and F-wave latency on all the motor nerves tested.  There is also evidence of right tibial motor temporal dispersion, patchy area of sensory loss.  EMG nerve conduction study showed mild decreased recruitment, no evidence of active denervation. ? ?UPDATE Jan 04 2020: ?CSF study on November 10, 2019 showed total protein of 168, confirming the diagnosis of CIDP, ? ?Laboratory evaluation showed no treatable etiology, vitamin D was 28, normal CBC, hemoglobin of 15.4, normal negative protein electrophoresis, ANA, hepatitis panel, ESR, thyroid panel, HIV folic acid, C-reactive protein ? ?He was offered steroid treatment initially, but he refused worried about the side effect, eventually started IVIG treatment in early October 2021, reported moderate improvement following loading dose, but after receiving second treatment November 1 and 2, he complains of increased weakness, bloodshot eye, gait abnormality, to the point of using walker ? ?He eventually presented to [facility] emergency room, was started on prednisone 50 mg since December 27, 2019, a day later, he noticed significant improvement, he reported now he is back to 80% of baseline, designed to go back to work on [employer] duty, he denies significant side effect from steroid, was also offered antibiotic treatment every other day for unknown reasons, ? ?Laboratory evaluation in November 2021, sodium 129, repeat 134, normal CBC, UA showed protein 30 mg per DL, moderate blood, RBC was 8, ? ?UPDATE Feb 22 2020: ?He reported 95% back to his baseline, only has mild upper extremity weakness, slight difficulty doing push-ups, no longer has gait abnormality, no significant paresthesia ? ?Prednisone 50 mg daily was  started since 12/27/2019, taper down to 40 mg 01/24/2020, 10 mg decrement every 4 weeks ?IVIG loading dose 2 g/kg in October 2021, followed by 1 g/kg every 3 to 4 weeks for total of 6 treatment, ? ?UPDATE June 06 2020: ?He was doing very well on tapering dose of prednisone, 5 mg daily, decided to stop prednisone abruptly for about 2 weeks in early April, about 1 week later, he noticed recurrent numbness tingling, he put himself back on higher dose of prednisone 10 mg daily since May 25, 2020, about a week later, he noticed improvement, now almost back to his baseline, he denies upper extremity symptoms, occasionally mild unsteady gait, but barely noticeable, work full-time without difficulty ? ?Update Jun 27, 2020 ?He return for electrodiagnostic study today, which continue to show evidence of chronic demyelinating polyradiculoneuropathy, similar to previous study.  Prior to treatment on November 09, 2019 ? ?His symptoms has improved since higher dose of prednisone 10 mg daily, but he continue complains of lack of stamina, difficulty sleeping at nighttime, slight gait abnormality with prolonged walking ? ?UPDATE September 26 2019: ?We restarted ivig June 18 loading, then monthly,  ? ?Not at his best, feet togather, mild balance issure, had toe, tight, 80%, strength is good, sprintly in quarter, work out gym ? ?UPDATE Mar 19 2021: ?He was normal, he fell off trik, broke his ankle on Oct 29th 2022, stop prednisone and methotrexate on Nov 8th 2022, began to noticed increased bilateral foot, finger paresthesia since Jan 2023, also noticed increased unsteady gait, hand clumsiness ? ? ?He does not want to restart methotrexate, planning on starting family  with his wife, he will be off work until April 16, 2021 ? ?Update April 29, 2021: ?He was started on prednisone 50 mg daily since last visit on March 19, 2021, he noticed some improvement, but was not able to bounce back to previous level prior to his ankle fracture, he  also started with IVIG Octagam loading dose 2 g/kg, and then maintenance dose 1 g/kg, he did not notice any significant improvement following IVIG, wondering if other 10% such as Penzyga will work better

## 2021-05-07 ENCOUNTER — Encounter: Payer: Self-pay | Admitting: Neurology

## 2021-05-27 NOTE — Procedures (Signed)
? ? ? ?   ?Full Name: Andre Cole Gender: Male ?MRN #: [id number] Date of Birth: 1990-09-10 ?   ?Visit Date: 05/06/2021 10:31 ?Age: 31 Years ?Examining Physician: Levert Feinstein, MD  ?Referring Physician: Levert Feinstein, MD ?History: 31 year old male, with history of CIDP, continue complains lower extremity paresthesia, mild gait abnormality following IVIG treatment ? ?Summary of the test: ? ?Nerve conduction study: ? ?Right sural, superficial peroneal, median and ulnar sensory response was absent. ? ?Right upper and lower extremity motor responses showed significantly prolonged distal latency, F-wave latency, there is also evidence of mild to moderately decreased CMAP amplitude at right peroneal to EDB, right tibial motor responses.  There is evidence of right tibial motor temporal dispersion, slow conduction velocity of 31 m/s. ? ?Electromyography: ?Selected needle examination was performed at right upper, lower extremity muscles, right cervical and lumbosacral paraspinal muscles. ? ?There is evidence of chronic neuropathic changes, only mild active denervation at right distal leg muscles.  There is also evidence of increased insertional activity at right lower lumbar paraspinal muscles. ? ?Conclusion: ?This is an abnormal study.  There is electrodiagnostic evidence of chronic demyelinating polyradiculoneuropathy, compared to previous study in May 2022, there is no significant change. ? ? ? ?------------------------------- ?Levert Feinstein M.D. Ph.D. ? ?[facility] ?[address], [address] ?[place], [place] [address] ?Tel: [phone] ?Fax: [phone] ? ?Verbal informed consent was obtained from the patient, patient was informed of potential risk of procedure, including bruising, bleeding, hematoma formation, infection, muscle weakness, muscle pain, numbness, among others. ?   ? ?   ?MNC ?   ?Nerve / Sites Muscle Latency Ref. Amplitude Ref. Rel Amp Segments Distance Velocity Ref. Area  ?  ms ms mV mV %  cm  m/s m/s mVms  ?R Median - APB  ?   Wrist APB 8.9 ?4.4 4.4 ?4.0 100 Wrist - APB 7   19.0  ?   Upper arm APB 13.3  3.9  90.3 Upper arm - Wrist 24 54 ?49 18.5  ?R Ulnar - ADM  ?   Wrist ADM 7.0 ?3.3 0.8 ?6.0 100 Wrist - ADM 7   3.3  ?   B.Elbow ADM 11.4  4.8  591 B.Elbow - Wrist 23 52 ?49 19.5  ?   A.Elbow ADM 13.3  4.2  87.6 A.Elbow - B.Elbow 10 52 ?49 20.1  ?R Peroneal - EDB  ?   Ankle EDB 15.6 ?6.5 1.8 ?2.0 100 Ankle - EDB 9   12.0  ?   Fib head EDB 24.4  1.5  86.2 Fib head - Ankle 31 35 ?44 9.1  ?   Pop fossa EDB 27.3  1.3  88.2 Pop fossa - Fib head 10 34 ?44 8.4  ?       Pop fossa - Ankle      ?R Tibial - AH  ?   Ankle AH 16.3 ?5.8 1.0 ?4.0 100 Ankle - AH 9   6.6  ?   Pop fossa AH 29.7  0.4  34.9 Pop fossa - Ankle 41 31 ?41 4.8  ?           ?SNC ?   ?Nerve / Sites Rec. Site Peak Lat Ref.  Amp Ref. Segments Distance  ?  ms ms ?V ?V  cm  ?R Sural - Ankle (Calf)  ?   Calf Ankle NR ?4.4 NR ?6 Calf - Ankle 14  ?R Superficial peroneal - Ankle  ?   Lat leg Ankle NR ?  4.4 NR ?6 Lat leg - Ankle 14  ?R Median - Orthodromic (Dig II, Mid palm)  ?   Dig II Wrist NR ?3.4 NR ?10 Dig II - Wrist 13  ?R Ulnar - Orthodromic, (Dig V, Mid palm)  ?   Dig V Wrist NR ?3.1 NR ?5 Dig V - Wrist 11  ?           ?F  Wave ?   ?Nerve F Lat Ref.  ? ms ms  ?R Tibial - AH 101.2 ?56.0  ?R Ulnar - ADM 46.0 ?32.0  ?       ?EMG Summary Table   ? Spontaneous MUAP Recruitment  ?Muscle IA Fib PSW Fasc Other Amp Dur. Poly Pattern  ?R. Abductor hallucis Increased 1+ 1+ None _______ Increased Increased 1+ Reduced  ?R. Tibialis anterior Increased 1+ None None _______ Increased Increased 1+ Reduced  ?R. Tibialis posterior Increased None None None _______ Increased Increased 1+ Reduced  ?R. Peroneus longus Increased None None None _______ Increased Increased 1+ Reduced  ?R. Vastus lateralis Increased None None None _______ Increased Increased 1+ Reduced  ?R. Lumbar paraspinals (low) Increased None None None _______ Normal Normal Normal Normal  ?R. Lumbar  paraspinals (mid) Increased None None None _______ Normal Normal Normal Normal  ?R. First dorsal interosseous Increased None None None _______ Normal Normal Normal Reduced  ?R. Pronator teres Increased None None None _______ Normal Normal Normal Reduced  ?R. Biceps brachii Increased None None None _______ Normal Normal Normal Reduced  ?R. Deltoid Increased None None None _______ Normal Normal Normal Reduced  ?R. Triceps brachii Increased None None None _______ Normal Normal Normal Reduced  ?R. Cervical paraspinals Normal None None None _______ Normal Normal Normal Normal  ? ?  ? ?

## 2021-05-29 ENCOUNTER — Encounter: Payer: BC Managed Care – PPO | Admitting: Neurology

## 2021-12-04 ENCOUNTER — Ambulatory Visit: Payer: BC Managed Care – PPO | Admitting: Neurology

## 2021-12-04 ENCOUNTER — Other Ambulatory Visit (INDEPENDENT_AMBULATORY_CARE_PROVIDER_SITE_OTHER): Payer: BC Managed Care – PPO

## 2021-12-04 ENCOUNTER — Ambulatory Visit (INDEPENDENT_AMBULATORY_CARE_PROVIDER_SITE_OTHER): Payer: BC Managed Care – PPO | Admitting: *Deleted

## 2021-12-04 VITALS — BP 140/80 | HR 68

## 2021-12-04 DIAGNOSIS — R569 Unspecified convulsions: Secondary | ICD-10-CM

## 2021-12-04 DIAGNOSIS — G6289 Other specified polyneuropathies: Secondary | ICD-10-CM | POA: Diagnosis not present

## 2021-12-04 DIAGNOSIS — Z0289 Encounter for other administrative examinations: Secondary | ICD-10-CM

## 2021-12-04 NOTE — Progress Notes (Signed)
ASSESSMENT AND PLAN  Andre Cole is a 31 y.o. male   Chronic inflammatory demyelinating polyradiculoneuropathy  Gradually onset progressive clinical course since May 2021, distal weakness, areflexia, loss of toes proprioception, wide-based unsteady gait with positive Romberg signs,  EMG nerve conduction study also support a diagnosis of chronic demyelinating polyradiculoneuropathy, as evidenced by significantly prolonged distal latency, F-wave latency of the ulnar motor nerves tested, temporal dispersion at right tibial motor response, patchy area of sensory loss, there was no axonal loss on EMG study,  Fluoroscopy guided lumbar puncture on November 10, 2019, total protein was 168,  Laboratory evaluations showed no treatable etiology, with mildly decreased vitamin D 28,  He responded very well to IVIG, initial treatment was in October 2021   Prednisone was started since December 27, 2019, 50 mg daily, reported significant improvement,  He reported recurrent bilateral lower extremity paresthesia, slight unsteady gait after stop prednisone from 5 mg in early April 2022, which is earlier than intended, also reported improvement of his symptoms after restarting higher dose of prednisone up to 10 mg daily,    Repeat EMG nerve conduction study on Jun 27, 2020, and May 06, 2021 continue to show evidence of chronic demyelinating polyradiculoneuropathy, no significant change He was on prednisone 10 mg daily since April 2022,  methotrexate 2.5 mg 4 tablets every week was added on since April 3662, along with folic acid daily Broke his left ankle on December 08, 2020, prepared for the surgery, he stopped the prednisone and the methotrexate abruptly on December 18, 2020, began to notice recurrent bilateral toes numbness after he is off the boot, in January 2023, also noticed bilateral finger paresthesia recently, examination showed recurrence distal weakness, sensory loss, unsteady gait  He is on  tapering dose of prednisone 50 mg every morning for 2 weeks, 10 mg decrement every 2 weeks, now on 20 mg daily, he is doing very well now, now back to presurgical level, did not notice recurrent symptoms with tapering dose of prednisone, continue taper down prednisone to 15 mg daily, his wife is conceived, restarted methotrexate 2.5 mg 6 tablets daily in March 9476, along with folic acid Has been receiving IVIG 10% 1 g/kg every 3 to 4 weeks   New onset seizure-like activity,    Following IVIG infusion today December 04, 2021  Seizure versus syncope   Complete evaluation with MRI of the brain with without contrast  EEG today  Laboratory evaluation  No driving until seizure-free for 6 months  Return to clinic with nurse practitioner in 4 to 6 weeks  DIAGNOSTIC DATA (LABS, IMAGING, TESTING) - I reviewed patient records, labs, notes, testing and imaging myself where available. August 02, 2020, normal IgA, CMP, CPK, TSH, hepatitis B, C, RPR, B12, CBC     HISTORICAL  Andre Cole is a 31 year old male, seen in request by my colleague Dr. Brigitte Pulse for evaluation of gradual onset bilateral upper and lower extremity paresthesia, gait abnormality, electrodiagnostic study  I reviewed and summarized the referring note. 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 MRI of cervical spine, no significant abnormality  Lab in 2021, normal ESR, C-reactive protein, ANA, rheumatoid factor, CPK,  EMG nerve conduction study today November 09, 2019, showed evidence of findings consistent with CIDP, there is evidence of  significantly prolonged distal latency, and F-wave latency on all the motor nerves tested.  There is also evidence of right tibial motor temporal dispersion, patchy area of sensory loss.  EMG nerve conduction study showed mild decreased recruitment, no evidence of active denervation.  UPDATE Jan 04 2020: CSF study on November 10, 2019 showed total protein of 168, confirming the diagnosis of CIDP,  Laboratory evaluation showed no treatable etiology, vitamin D was 28, normal CBC, hemoglobin of 15.4, normal negative protein electrophoresis, ANA, hepatitis panel, ESR, thyroid panel, HIV folic acid, C-reactive protein  He was offered steroid treatment initially, but he refused worried about the side effect, eventually started IVIG treatment in early October 2021, reported moderate improvement following loading dose, but after receiving second treatment November 1 and 2, he complains of increased weakness, bloodshot eye, gait abnormality, to the point of using walker  He eventually presented to [facility] emergency room, was started on prednisone 50 mg since December 27, 2019, a day later, he noticed significant improvement, he reported now he is back to 80% of baseline, designed to go back to work on [employer] duty, he denies significant side effect from steroid, was also offered antibiotic treatment every other day for unknown reasons,  Laboratory evaluation in November 2021, sodium 129, repeat 134, normal CBC, UA showed protein 30 mg per DL, moderate blood, RBC was  8,  UPDATE Feb 22 2020: He reported 95% back to his baseline, only has mild upper extremity weakness, slight difficulty doing push-ups, no longer has gait abnormality, no significant paresthesia  Prednisone 50 mg daily was started since 12/27/2019, taper down to 40 mg 01/24/2020, 10 mg decrement every 4 weeks IVIG loading dose 2 g/kg in October 2021, followed by 1 g/kg every 3 to 4 weeks for total of 6 treatment,  UPDATE June 06 2020: He was doing very well on tapering dose of prednisone, 5 mg daily, decided to stop prednisone abruptly for about 2 weeks in early April, about 1 week later, he noticed recurrent numbness tingling, he put himself back on higher dose of prednisone 10 mg daily since May 25, 2020, about a week later, he noticed improvement, now almost back to his baseline, he denies upper extremity symptoms, occasionally mild unsteady gait, but barely noticeable, work full-time without difficulty  Update Jun 27, 2020 He return for electrodiagnostic study today, which continue to show evidence of chronic demyelinating polyradiculoneuropathy, similar to previous study.  Prior to treatment on November 09, 2019  His symptoms has improved since higher dose of prednisone 10 mg daily, but he continue complains of lack of stamina, difficulty sleeping at nighttime, slight gait abnormality with prolonged walking  UPDATE September 26 2019: We restarted ivig June 18 loading, then monthly,   Not at his best, feet togather, mild balance issure, had toe, tight, 80%, strength is good, sprintly in quarter, work out gym  UPDATE Mar 19 2021: He was normal, he fell off trik, broke his  ankle on Oct 29th 2022, stop prednisone and methotrexate on Nov 8th 2022, began to noticed increased bilateral foot, finger paresthesia since Jan 2023, also noticed increased unsteady gait, hand clumsiness   He does not want to restart methotrexate, planning on starting family with his wife, he will be off work until  April 16, 2021  Update April 29, 2021: He was started on prednisone 50 mg daily since last visit on March 19, 2021, he noticed some improvement, but was not able to bounce back to previous level prior to his ankle fracture, he also started with IVIG Octagam loading dose 2 g/kg, and then maintenance dose 1 g/kg, he did not notice any significant improvement following IVIG, wondering if other 10% such as Penzyga will work better for her condition  UPDATE May 06 2021: He return for electrodiagnostic study today, continues show evidence of chronic demyelinating polyradiculoneuropathy, there is evidence of mild axonal loss, increased insertional activity, spontaneous activity at right abductor pollicis hallucis, there was no significant change compared to previous study in May 2022  He reported suboptimal to IVIG treatment alone, from end of October 2022 to February 2023, he was on IVIG treatment 1 g/kg every months along, methotrexate and prednisone has to be on hold because of his left ankle fracture, surgery  Since he was started on prednisone, he noticed improvement, 10 mg decrement every 2 weeks, now on 20 mg daily now, no change in his symptoms with tapering down dose of prednisone, his wife is conceived, he is willing to start methotrexate again  UPDATE Dec 04 2021: Finish today's scheduled IVIG infusion, 1 g/kg, when he stand up, he complains of lightheadedness, fall back to his recliner, had weakness generalized seizure, afterwards, he feels clammy, sweaty, mostly transient chest pain, now almost back to baseline,  He had his first 11 days ago, had a lot of sleepless night, unless this is not his first IVIG, no history of seizure  PHYSICAL EXAM     Today's Vitals   12/04/21 1550  BP: (!) 140/80  Pulse: 68   There is no height or weight on file to calculate BMI.   Body mass index is 27.25 kg/m.  PHYSICAL EXAMNIATION:  Gen: NAD, conversant, well nourised, well groomed     NEUROLOGICAL EXAM:  MENTAL STATUS: Speech/cognition: Tired looking young male, perspiration profusely, now awake alert oriented to history taking and casual conversation   CRANIAL NERVES: CN II: Visual fields are full to confrontation. Pupils are round equal and briskly reactive to light. CN III, IV, VI: extraocular movement are normal. No ptosis. CN V: Facial sensation is intact to light touch CN VII: Face is symmetric with normal eye closure  CN VIII: Hearing is normal to causal conversation. CN IX, X: Phonation is normal. CN XI: Head turning and shoulder shrug are intact  MOTOR:  no significant upper or lower extremity muscle weakness  REFLEXES: Areflexia  SENSORY: Preserved bilateral toe proprioception, vibratory sensation to 5 seconds  COORDINATION: There is no trunk or limb dysmetria noted.  GAIT/STANCE: Cautious gait  REVIEW OF SYSTEMS: Full 14 system review of systems performed and notable only for as above All other review of systems were negative.  ALLERGIES: No Known Allergies  HOME MEDICATIONS: Current Outpatient Medications  Medication Sig Dispense Refill   folic acid (FOLVITE) 1 MG tablet Take 1 tablet (1 mg total) by mouth daily. (Patient not taking: Reported on 03/19/2021) 90 tablet 3   methotrexate (RHEUMATREX) 2.5 MG tablet Take 1 tablet (  2.5 mg total) by mouth once a week. Caution:Chemotherapy. Protect from light. 30 tablet 11   Multiple Vitamin (MULTIVITAMIN) tablet Take 1 tablet by mouth daily.     predniSONE (DELTASONE) 10 MG tablet 50m daily x2 weeks, then 126mdecrement every 2 weeks, 120 tablet 3   TURMERIC PO Take 1 tablet by mouth daily. (Patient not taking: Reported on 03/19/2021)     No current facility-administered medications for this visit.    PAST MEDICAL HISTORY: Past Medical History:  Diagnosis Date   Allergy    Asthma     PAST SURGICAL HISTORY: No past surgical history on file.  FAMILY HISTORY: No family history on  file.  SOCIAL HISTORY: Social History   Socioeconomic History   Marital status: Single    Spouse name: Not on file   Number of children: Not on file   Years of education: Not on file   Highest education level: Not on file  Occupational History   Not on file  Tobacco Use   Smoking status: Former    Types: Cigars    Quit date: 02/10/2014    Years since quitting: 7.8   Smokeless tobacco: Never   Tobacco comments:    no marjuina or cigars or cig since 2016 but vaps  Vaping Use   Vaping Use: Every day  Substance and Sexual Activity   Alcohol use: Yes    Alcohol/week: 1.0 standard drink of alcohol    Types: 1 Standard drinks or equivalent per week    Comment: 2x/week - 2-4 liquior drinks a week   Drug use: No   Sexual activity: Yes    Partners: Female  Other Topics Concern   Not on file  Social History Narrative   Lives with roommate   Right Handed   Drink a preworkout drink 2-3 x/week   Social Determinants of Health   Financial Resource Strain: Not on file  Food Insecurity: Not on file  Transportation Needs: Not on file  Physical Activity: Not on file  Stress: Not on file  Social Connections: Not on file  Intimate Partner Violence: Not on file     YiMarcial PacasM.D. Ph.D.  [facility] [address] StreetSuPes[place]um[place] [id number]: [phone]: [phone]:  No referring provider defined for this encounter.  Scifres, DoEarlie ServerPA-C (Inactive)

## 2021-12-05 ENCOUNTER — Telehealth: Payer: Self-pay | Admitting: Neurology

## 2021-12-05 NOTE — Telephone Encounter (Signed)
Pt wants to hold off on scheduling MRI, states he does not have the money right now. Pt was informed of payment plan option, still chose to wait.

## 2021-12-10 ENCOUNTER — Other Ambulatory Visit: Payer: Self-pay | Admitting: Neurology

## 2021-12-11 ENCOUNTER — Other Ambulatory Visit: Payer: BC Managed Care – PPO

## 2021-12-11 LAB — DRUG SCREEN 10 W/CONF, SERUM
Amphetamines, IA: NEGATIVE ng/mL
Barbiturates, IA: NEGATIVE ug/mL
Benzodiazepines, IA: NEGATIVE ng/mL
Cocaine & Metabolite, IA: NEGATIVE ng/mL
Methadone, IA: NEGATIVE ng/mL
Opiates, IA: NEGATIVE ng/mL
Oxycodones, IA: NEGATIVE ng/mL
Phencyclidine, IA: NEGATIVE ng/mL
Propoxyphene, IA: NEGATIVE ng/mL
THC(Marijuana) Metabolite, IA: NEGATIVE ng/mL

## 2021-12-11 LAB — COMPREHENSIVE METABOLIC PANEL
ALT: 32 IU/L (ref 0–44)
AST: 31 IU/L (ref 0–40)
Albumin/Globulin Ratio: 0.9 — ABNORMAL LOW (ref 1.2–2.2)
Albumin: 3.8 g/dL — ABNORMAL LOW (ref 4.1–5.1)
Alkaline Phosphatase: 52 IU/L (ref 44–121)
BUN/Creatinine Ratio: 22 — ABNORMAL HIGH (ref 9–20)
BUN: 17 mg/dL (ref 6–20)
Bilirubin Total: 0.3 mg/dL (ref 0.0–1.2)
CO2: 26 mmol/L (ref 20–29)
Calcium: 9.6 mg/dL (ref 8.7–10.2)
Chloride: 98 mmol/L (ref 96–106)
Creatinine, Ser: 0.77 mg/dL (ref 0.76–1.27)
Globulin, Total: 4.4 g/dL (ref 1.5–4.5)
Glucose: 112 mg/dL — ABNORMAL HIGH (ref 70–99)
Potassium: 3.7 mmol/L (ref 3.5–5.2)
Sodium: 136 mmol/L (ref 134–144)
Total Protein: 8.2 g/dL (ref 6.0–8.5)
eGFR: 123 mL/min/{1.73_m2} (ref >59)

## 2021-12-11 LAB — CBC WITH DIFFERENTIAL/PLATELET
Basophils Absolute: 0 10*3/uL (ref 0.0–0.2)
Basos: 0 %
EOS (ABSOLUTE): 0.1 10*3/uL (ref 0.0–0.4)
Eos: 1 %
Hematocrit: 44.4 % (ref 37.5–51.0)
Hemoglobin: 15 g/dL (ref 13.0–17.7)
Immature Grans (Abs): 0 10*3/uL (ref 0.0–0.1)
Immature Granulocytes: 0 %
Lymphocytes Absolute: 4.5 10*3/uL — ABNORMAL HIGH (ref 0.7–3.1)
Lymphs: 48 %
MCH: 27.2 pg (ref 26.6–33.0)
MCHC: 33.8 g/dL (ref 31.5–35.7)
MCV: 80 fL (ref 79–97)
Monocytes Absolute: 0.5 10*3/uL (ref 0.1–0.9)
Monocytes: 6 %
Neutrophils Absolute: 4.2 10*3/uL (ref 1.4–7.0)
Neutrophils: 45 %
Platelets: 286 10*3/uL (ref 150–450)
RBC: 5.52 x10E6/uL (ref 4.14–5.80)
RDW: 13.8 % (ref 11.6–15.4)
WBC: 9.3 10*3/uL (ref 3.4–10.8)

## 2021-12-11 LAB — TSH: TSH: 1.41 u[IU]/mL (ref 0.450–4.500)

## 2021-12-11 LAB — TROPONIN T: Troponin T (Highly Sensitive): 10 ng/L (ref 0–22)

## 2021-12-11 NOTE — Procedures (Signed)
   HISTORY: 31 -year-old male with witnessed seizure-like event following his IVIG infusion  TECHNIQUE:  This is a routine 16 channel EEG recording with one channel devoted to a limited EKG recording.  It was performed during wakefulness, drowsiness and asleep.  Hyperventilation and photic stimulation were performed as activating procedures.  There are minimum muscle and movement artifact noted.  Upon maximum arousal, posterior dominant waking rhythm consistent of rhythmic alpha range activity. Activities are symmetric over the bilateral posterior derivations and attenuated with eye opening.  Hyperventilation produced no significant alteration of the tracing.  Photic stimulation did not alter the tracing.  During EEG recording, patient developed drowsiness and no deeper stage of sleep was achieved.  During EEG recording, there was no epileptiform discharge noted.  EKG demonstrate sinus rhythm, with heart rate of 80 bpm.  CONCLUSION: This is a  normal awake EEG.  There is no electrodiagnostic evidence of epileptiform discharge.  Marcial Pacas, M.D. Ph.D.  [facility] [place], [place] [address] Phone: [phone] Fax:      [phone]

## 2022-01-16 ENCOUNTER — Ambulatory Visit: Payer: BC Managed Care – PPO | Admitting: Neurology

## 2022-01-23 ENCOUNTER — Encounter: Payer: Self-pay | Admitting: Neurology

## 2022-01-23 NOTE — Telephone Encounter (Signed)
Please call him for earlier follow up to my next available.

## 2022-03-13 ENCOUNTER — Ambulatory Visit (INDEPENDENT_AMBULATORY_CARE_PROVIDER_SITE_OTHER): Payer: BC Managed Care – PPO | Admitting: Neurology

## 2022-03-13 ENCOUNTER — Encounter: Payer: Self-pay | Admitting: Neurology

## 2022-03-13 VITALS — BP 138/92 | HR 80 | Ht 69.0 in | Wt 226.2 lb

## 2022-03-13 DIAGNOSIS — G6289 Other specified polyneuropathies: Secondary | ICD-10-CM | POA: Diagnosis not present

## 2022-03-13 DIAGNOSIS — R569 Unspecified convulsions: Secondary | ICD-10-CM

## 2022-03-13 DIAGNOSIS — G6181 Chronic inflammatory demyelinating polyneuritis: Secondary | ICD-10-CM

## 2022-03-13 MED ORDER — METHOTREXATE SODIUM 2.5 MG PO TABS: 2.5000 mg | ORAL_TABLET | ORAL | 11 refills | Status: DC

## 2022-03-13 MED ORDER — PREDNISONE 5 MG PO TABS: 7.5000 mg | ORAL_TABLET | Freq: Every day | ORAL | 6 refills | Status: DC

## 2022-03-13 MED ORDER — FOLIC ACID 1 MG PO TABS: 1.0000 mg | ORAL_TABLET | Freq: Every day | ORAL | 3 refills | Status: DC

## 2022-03-13 MED ORDER — METHOTREXATE SODIUM 2.5 MG PO TABS: 10.0000 mg | ORAL_TABLET | ORAL | 11 refills | Status: DC

## 2022-03-13 NOTE — Progress Notes (Addendum)
Chief Complaint  Patient presents with   Follow-up    Rm 14 alone. Here for f/u. Reports he has been doing ok since last f/u he has been doing well. Some changes in his vision    ASSESSMENT AND PLAN  Andre Cole is a 32 y.o. male   Chronic inflammatory demyelinating polyradiculoneuropathy (G61.81)  Gradually onset progressive clinical course since May 2021,    EMG nerve conduction study also support a diagnosis of chronic demyelinating polyradiculoneuropathy, as evidenced by significantly prolonged distal latency, F-wave latency of the ulnar motor nerves tested, temporal dispersion at right tibial motor response, patchy area of sensory loss, there was no axonal loss on EMG study,  Fluoroscopy guided lumbar puncture on November 10, 2019, total protein was 168,   He responded very well to IVIG, initial treatment was in October 2021   Prednisone was started since December 27, 2019, 50 mg daily, reported significant improvement,  He reported recurrent bilateral lower extremity paresthesia, slight unsteady gait after stop prednisone from 5 mg in early April 2022, which is earlier than intended, subjective improvement of his symptoms after restarting higher dose of prednisone up to 10 mg daily,     EMG nerve conduction study on Jun 27, 2020, and May 06, 2021 continue to show evidence of chronic demyelinating polyradiculoneuropathy, no significant change He was on prednisone 10 mg daily since April 2022,  methotrexate 2.5 mg 4 tablets every week was added on since April 2022, along with folic acid daily Broke his left ankle on December 08, 2020, prepared for the surgery, he stopped the prednisone and the methotrexate abruptly on December 18, 2020, began to notice recurrent symptoms  He was treated with higher tapering dose of prednisone 50 mg every morning in early 2023, with improvement with his symptoms   Restarted methotrexate 2.5 mg 6 tablets daily in March 2023, along with folic  acid Continued IVIG, but had his first generalized seizure December 04, 2021 at the end of IVIG infusion, which has been on hold, Now remains symptomatic, we will restart IVIG, methotrexate 2.5 mg 4 tablets daily, lower dose of prednisone from 10 to 7.5 mg daily to decrease long-term side effect,  Return To Clinic With NP In 6 Months depend on his response to IVIG, if he is doing well, may consider lower dose of prednisone, 5 mg daily as maintenance dose, previously tapering of prednisone because flareup of his symptoms,     DIAGNOSTIC DATA (LABS, IMAGING, TESTING) - I reviewed patient records, labs, notes, testing and imaging myself where available. August 02, 2020, normal IgA, CMP, CPK, TSH, hepatitis B, C, RPR, B12, CBC   HISTORICAL  Andre Cole is a 31 year old male, seen in request by my colleague Dr. Miachel Roux for evaluation of gradual onset bilateral upper and lower extremity paresthesia, gait abnormality, electrodiagnostic study  I reviewed and summarized the referring note. 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 MRI of cervical spine, no significant  abnormality  Lab in 2021, normal ESR, C-reactive protein, ANA, rheumatoid factor, CPK,  EMG nerve conduction study today November 09, 2019, showed evidence of findings consistent with CIDP, there is evidence of  significantly prolonged distal latency, and F-wave latency on all the motor nerves tested.  There is also evidence of right tibial motor temporal dispersion, patchy area of sensory loss.  EMG nerve conduction study showed mild decreased recruitment, no evidence of active denervation.  UPDATE Jan 04 2020: CSF study on November 10, 2019 showed total protein of 168, confirming the diagnosis of CIDP,  Laboratory evaluation showed no treatable etiology, vitamin D was 28, normal CBC, hemoglobin of 15.4, normal negative protein electrophoresis, ANA, hepatitis panel, ESR, thyroid panel, HIV folic acid, C-reactive protein  He was offered steroid treatment initially, but he refused worried about the side effect, eventually started IVIG treatment in early October 2021, reported moderate improvement following loading dose, but after receiving second treatment November 1 and 2, he complains of increased weakness, bloodshot eye, gait abnormality, to the point of using walker  He eventually presented to [facility] emergency room, was started on prednisone 50 mg since December 27, 2019, a day later, he noticed significant improvement, he reported now he is back to 80% of baseline, designed to go back to work on [employer] duty, he denies significant side effect from steroid, was also offered antibiotic treatment every other day for unknown reasons,  Laboratory evaluation in November 2021, sodium 129, repeat 134, normal CBC, UA showed protein 30 mg per DL, moderate blood, RBC was 8,  UPDATE Feb 22 2020: He reported 95% back to his baseline, only has mild upper extremity weakness, slight difficulty doing push-ups, no longer has gait abnormality, no significant paresthesia  Prednisone 50 mg daily was started since  12/27/2019, taper down to 40 mg 01/24/2020, 10 mg decrement every 4 weeks IVIG loading dose 2 g/kg in October 2021, followed by 1 g/kg every 3 to 4 weeks for total of 6 treatment,  UPDATE June 06 2020: He was doing very well on tapering dose of prednisone, 5 mg daily, decided to stop prednisone abruptly for about 2 weeks in early April, about 1 week later, he noticed recurrent numbness tingling, he put himself back on higher dose of prednisone 10 mg daily since May 25, 2020, about a week later, he noticed improvement, now almost back to his baseline, he denies upper extremity symptoms, occasionally mild unsteady gait, but barely noticeable, work full-time without difficulty  Update Jun 27, 2020 He return for electrodiagnostic study today, which continue to show evidence of chronic demyelinating polyradiculoneuropathy, similar to previous study.  Prior to treatment on November 09, 2019  His symptoms has improved since higher dose of prednisone 10 mg daily, but he continue complains of lack of stamina, difficulty sleeping at nighttime, slight gait abnormality with prolonged walking  UPDATE September 26 2019: We restarted ivig June 18 loading, then monthly,   Not at his best, feet togather, mild balance issure, had toe, tight, 80%, strength is good, sprintly in quarter, work out gym  UPDATE Mar 19 2021: He was normal, he fell off trik, broke his ankle on Oct 29th 2022, stop prednisone and methotrexate on Nov 8th 2022, began to noticed increased bilateral foot, finger paresthesia since Jan 2023,  also noticed increased unsteady gait, hand clumsiness   He does not want to restart methotrexate, planning on starting family with his wife, he will be off work until April 16, 2021  Update April 29, 2021: He was started on prednisone 50 mg daily since last visit on March 19, 2021, he noticed some improvement, but was not able to bounce back to previous level prior to his ankle fracture, he also started  with IVIG Octagam loading dose 2 g/kg, and then maintenance dose 1 g/kg, he did not notice any significant improvement following IVIG, wondering if other 10% such as Penzyga will work better for her condition  UPDATE May 06 2021: He return for electrodiagnostic study today, continues show evidence of chronic demyelinating polyradiculoneuropathy, there is evidence of mild axonal loss, increased insertional activity, spontaneous activity at right abductor pollicis hallucis, there was no significant change compared to previous study in May 2022  He reported suboptimal to IVIG treatment alone, from end of October 2022 to February 2023, he was on IVIG treatment 1 g/kg every months along, methotrexate and prednisone has to be on hold because of his left ankle fracture, surgery  Since he was started on prednisone, he noticed improvement, 10 mg decrement every 2 weeks, now on 20 mg daily now, no change in his symptoms with tapering down dose of prednisone, his wife is conceived, he is willing to start methotrexate again  Update to March 13, 2022,  He had a seizure at intra fusion on December 04, 2021 at the end of his IVIG infusion, no warning signs, EEG immediate post seizure showed no significant abnormality  This is his first such event, do not have family history of personal history of seizure, denies focal signs,  This happened in the setting of a lot of stress, including sleep deprivation with his newborn son, he has been doing well, 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  PHYSICAL EXAMNIATION:  Gen: NAD, conversant, well nourised, well groomed    NEUROLOGICAL EXAM:  MENTAL STATUS: Speech/cognition: Awake alert oriented to history taking and casual conversation   CRANIAL NERVES: CN II: Visual fields are full to confrontation. Pupils are round equal and briskly reactive to light. CN III, IV, VI: extraocular movement are normal. No ptosis. CN V: Facial sensation is intact to light touch CN VII: Face is symmetric with normal eye closure  CN VIII: Hearing is normal to causal conversation. CN IX, X: Phonation is normal. CN XI: Head turning and shoulder shrug are intact  MOTOR: He has mild bilateral toe extension weakness  REFLEXES: Areflexia  SENSORY: Preserved bilateral toe proprioception, pinprick, vibratory sensation to 5 seconds  COORDINATION: There is no trunk or limb dysmetria noted.  GAIT/STANCE: Wide-based cautious gait, mildly unsteady gait, could not stand up on tiptoes, heels, difficulty perform tandem walking, positive Romberg signs  REVIEW OF SYSTEMS: Full 14 system review of systems performed and notable only  for as above All other review of systems were negative.  ALLERGIES: No Known Allergies  HOME MEDICATIONS: Current Outpatient Medications  Medication Sig Dispense Refill   folic acid (FOLVITE) 1 MG tablet Take 1 tablet (1 mg total) by mouth daily. 90 tablet 3   methotrexate (RHEUMATREX) 2.5 MG tablet Take 1 tablet (2.5 mg total) by mouth once a week. Caution:Chemotherapy. Protect from light. 30 tablet 11   Multiple Vitamin (MULTIVITAMIN) tablet Take 1 tablet by mouth daily.     predniSONE (DELTASONE) 10 MG tablet 1 tablet per day 120 tablet 3   TURMERIC PO Take 1 tablet by mouth daily.     No current facility-administered medications for this visit.    PAST MEDICAL HISTORY: Past Medical History:  Diagnosis Date   Allergy    Asthma      PAST SURGICAL HISTORY: History reviewed. No pertinent surgical history.  FAMILY HISTORY: History reviewed. No pertinent family history.  SOCIAL HISTORY: Social History   Socioeconomic History   Marital status: Single    Spouse name: Not on file   Number of children: Not on file   Years of education: Not on file   Highest education level: Not on file  Occupational History   Not on file  Tobacco Use   Smoking status: Former    Types: Cigars    Quit date: 02/10/2014    Years since quitting: 8.0   Smokeless tobacco: Never   Tobacco comments:    no marjuina or cigars or cig since 2016 but vaps  Vaping Use   Vaping Use: Every day  Substance and Sexual Activity   Alcohol use: Yes    Alcohol/week: 1.0 standard drink of alcohol    Types: 1 Standard drinks or equivalent per week    Comment: 2x/week - 2-4 liquior drinks a week   Drug use: No   Sexual activity: Yes    Partners: Female  Other Topics Concern   Not on file  Social History Narrative   Lives with roommate   Right Handed   Drink a preworkout drink 2-3 x/week   Social Determinants of Health   Financial Resource Strain: Not on file  Food Insecurity: Not on file  Transportation Needs: Not on file  Physical Activity: Not on file  Stress: Not on file  Social Connections: Not on file  Intimate Partner Violence: Not on file     Levert Feinstein, M.D. Ph.D.  [facility] [address], [address] [place], [place] [address] Ph: [phone] Fax: [phone]  CC:  No referring provider defined for this encounter.  Scifres, Nicole Cella, PA-C (Inactive)

## 2022-03-13 NOTE — Addendum Note (Signed)
Addended by: Marcial Pacas on: 03/13/2022 02:05 PM   Modules accepted: Orders

## 2022-03-14 LAB — TSH: TSH: 1.79 u[IU]/mL (ref 0.450–4.500)

## 2022-03-14 LAB — CBC WITH DIFFERENTIAL
Basophils Absolute: 0.1 10*3/uL (ref 0.0–0.2)
Basos: 1 %
EOS (ABSOLUTE): 0.2 10*3/uL (ref 0.0–0.4)
Eos: 3 %
Hematocrit: 45.2 % (ref 37.5–51.0)
Hemoglobin: 15.2 g/dL (ref 13.0–17.7)
Immature Grans (Abs): 0 10*3/uL (ref 0.0–0.1)
Immature Granulocytes: 0 %
Lymphocytes Absolute: 2.9 10*3/uL (ref 0.7–3.1)
Lymphs: 41 %
MCH: 27.2 pg (ref 26.6–33.0)
MCHC: 33.6 g/dL (ref 31.5–35.7)
MCV: 81 fL (ref 79–97)
Monocytes Absolute: 0.5 10*3/uL (ref 0.1–0.9)
Monocytes: 7 %
Neutrophils Absolute: 3.3 10*3/uL (ref 1.4–7.0)
Neutrophils: 48 %
RBC: 5.58 x10E6/uL (ref 4.14–5.80)
RDW: 14.1 % (ref 11.6–15.4)
WBC: 7 10*3/uL (ref 3.4–10.8)

## 2022-03-14 LAB — HGB A1C W/O EAG: Hgb A1c MFr Bld: 5.8 % — ABNORMAL HIGH (ref 4.8–5.6)

## 2022-03-14 LAB — COMPREHENSIVE METABOLIC PANEL
ALT: 32 IU/L (ref 0–44)
AST: 43 IU/L — ABNORMAL HIGH (ref 0–40)
Albumin/Globulin Ratio: 1.9 (ref 1.2–2.2)
Albumin: 4.6 g/dL (ref 4.1–5.1)
Alkaline Phosphatase: 53 IU/L (ref 44–121)
BUN/Creatinine Ratio: 12 (ref 9–20)
BUN: 11 mg/dL (ref 6–20)
Bilirubin Total: 0.3 mg/dL (ref 0.0–1.2)
CO2: 24 mmol/L (ref 20–29)
Calcium: 9.8 mg/dL (ref 8.7–10.2)
Chloride: 103 mmol/L (ref 96–106)
Creatinine, Ser: 0.9 mg/dL (ref 0.76–1.27)
Globulin, Total: 2.4 g/dL (ref 1.5–4.5)
Glucose: 91 mg/dL (ref 70–99)
Potassium: 4.1 mmol/L (ref 3.5–5.2)
Sodium: 141 mmol/L (ref 134–144)
Total Protein: 7 g/dL (ref 6.0–8.5)
eGFR: 117 mL/min/{1.73_m2} (ref >59)

## 2022-03-14 LAB — IGG, IGA, IGM
IgA/Immunoglobulin A, Serum: 134 mg/dL (ref 90–386)
IgG (Immunoglobin G), Serum: 1161 mg/dL (ref 603–1613)
IgM (Immunoglobulin M), Srm: 120 mg/dL (ref 20–172)

## 2022-03-25 ENCOUNTER — Other Ambulatory Visit: Payer: BC Managed Care – PPO

## 2022-03-31 ENCOUNTER — Ambulatory Visit: Payer: BC Managed Care – PPO | Admitting: Neurology

## 2022-06-18 NOTE — Progress Notes (Unsigned)
No chief complaint on file.   HISTORY OF PRESENT ILLNESS:  06/18/22 ALL:  Andre Cole is a 32 y.o. male here today for follow up for CIDP and single seizure event occurring with last IVIG infusion 12/04/2021. EEG unremarkable. He was under more stress, sleep deprived with newborn and working 6p-6a in security at [employer]. He was last seen by Dr Terrace Arabia 03/2022. IVIG was restarted due to gait instability and lower ext weakness. Prednisone decreased from 10 to 7.5mg  daily and continued on methotrexate 10mg  weekly. Since,    HISTORY (copied from Dr Zannie Cove previous note)  Andre Cole is a 32 year old male, seen in request by my colleague Dr. Miachel Roux for evaluation of gradual onset bilateral upper and lower extremity paresthesia, gait abnormality, electrodiagnostic study  I reviewed and summarized the referring note. 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 MRI of cervical spine, no significant abnormality  Lab in 2021, normal ESR, C-reactive protein, ANA, rheumatoid factor, CPK,  EMG nerve conduction study today November 09, 2019, showed evidence of findings  consistent with CIDP, there is evidence of  significantly prolonged distal latency, and F-wave latency on all the motor nerves tested.  There is also evidence of right tibial motor temporal dispersion, patchy area of sensory loss.  EMG nerve conduction study showed mild decreased recruitment, no evidence of active denervation.  UPDATE Jan 04 2020: CSF study on November 10, 2019 showed total protein of 168, confirming the diagnosis of CIDP,  Laboratory evaluation showed no treatable etiology, vitamin D was 28, normal CBC, hemoglobin of 15.4, normal negative protein electrophoresis, ANA, hepatitis panel, ESR, thyroid panel, HIV folic acid, C-reactive protein  He was offered steroid treatment initially, but he refused worried about the side effect, eventually started IVIG treatment in early October 2021, reported moderate improvement following loading dose, but after receiving second treatment November 1 and 2, he complains of increased weakness, bloodshot eye, gait abnormality, to the point of using walker  He eventually presented to [facility] emergency room, was started on prednisone 50 mg since December 27, 2019, a day later, he noticed significant improvement, he reported now he is back to 80% of baseline, designed to go back to work on [employer] duty, he denies significant side effect from steroid, was also offered antibiotic treatment every other day for unknown reasons,  Laboratory evaluation in November 2021, sodium 129, repeat 134, normal CBC, UA showed protein 30 mg per DL, moderate blood, RBC was 8,  UPDATE Feb 22 2020: He reported 95% back to his baseline, only has mild upper extremity weakness, slight difficulty doing push-ups, no longer has gait abnormality, no significant paresthesia  Prednisone 50 mg daily was started since 12/27/2019, taper down to 40 mg 01/24/2020, 10 mg decrement every  4 weeks IVIG loading dose 2 g/kg in October 2021, followed by 1 g/kg every 3 to 4 weeks for total of 6  treatment,  UPDATE June 06 2020: He was doing very well on tapering dose of prednisone, 5 mg daily, decided to stop prednisone abruptly for about 2 weeks in early April, about 1 week later, he noticed recurrent numbness tingling, he put himself back on higher dose of prednisone 10 mg daily since May 25, 2020, about a week later, he noticed improvement, now almost back to his baseline, he denies upper extremity symptoms, occasionally mild unsteady gait, but barely noticeable, work full-time without difficulty  Update Jun 27, 2020 He return for electrodiagnostic study today, which continue to show evidence of chronic demyelinating polyradiculoneuropathy, similar to previous study.  Prior to treatment on November 09, 2019  His symptoms has improved since higher dose of prednisone 10 mg daily, but he continue complains of lack of stamina, difficulty sleeping at nighttime, slight gait abnormality with prolonged walking  UPDATE September 26 2019: We restarted ivig June 18 loading, then monthly,   Not at his best, feet togather, mild balance issure, had toe, tight, 80%, strength is good, sprintly in quarter, work out gym  UPDATE Mar 19 2021: He was normal, he fell off trik, broke his ankle on Oct 29th 2022, stop prednisone and methotrexate on Nov 8th 2022, began to noticed increased bilateral foot, finger paresthesia since Jan 2023, also noticed increased unsteady gait, hand clumsiness   He does not want to restart methotrexate, planning on starting family with his wife, he will be off work until April 16, 2021  Update April 29, 2021: He was started on prednisone 50 mg daily since last visit on March 19, 2021, he noticed some improvement, but was not able to bounce back to previous level prior to his ankle fracture, he also started with IVIG Octagam loading dose 2 g/kg, and then maintenance dose 1 g/kg, he did not notice any significant improvement following IVIG, wondering if other 10% such as  Penzyga will work better for her condition  UPDATE May 06 2021: He return for electrodiagnostic study today, continues show evidence of chronic demyelinating polyradiculoneuropathy, there is evidence of mild axonal loss, increased insertional activity, spontaneous activity at right abductor pollicis hallucis, there was no significant change compared to previous study in May 2022  He reported suboptimal to IVIG treatment alone, from end of October 2022 to February 2023, he was on IVIG treatment 1 g/kg every months along, methotrexate and prednisone has to be on hold because of his left ankle fracture, surgery  Since he was started on prednisone, he noticed improvement, 10 mg decrement every 2 weeks, now on 20 mg daily now, no change in his symptoms with tapering down dose of prednisone, his wife is conceived, he is willing to start methotrexate again  Update to March 13, 2022,  He had a seizure at intra fusion on December 04, 2021 at the end of his IVIG infusion, no warning signs, EEG immediate post seizure showed no significant abnormality  This is his first such event, do not have family history of personal history of seizure, denies focal signs,  This happened in the setting of a lot of stress, including sleep deprivation with his newborn son, he has been doing well, 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   ALLERGIES: No Known Allergies   HOME MEDICATIONS: Outpatient Medications Prior to Visit   Medication Sig Dispense Refill   folic acid (FOLVITE) 1 MG tablet Take 1 tablet (1 mg total) by mouth daily. 90 tablet 3   methotrexate (RHEUMATREX) 2.5 MG tablet Take 4 tablets (10 mg total) by mouth once a week. Caution:Chemotherapy. Protect from light. 20 tablet 11   Multiple Vitamin (MULTIVITAMIN) tablet Take 1 tablet by mouth daily.     predniSONE (DELTASONE) 5 MG tablet Take 1.5 tablets (7.5 mg total) by mouth daily with breakfast. 60 tablet 6   TURMERIC PO Take 1 tablet by mouth daily.     No facility-administered medications prior to visit.     PAST MEDICAL HISTORY: Past Medical History:  Diagnosis Date   Allergy    Asthma      PAST SURGICAL HISTORY: No past surgical history on file.   FAMILY HISTORY: No family history on file.   SOCIAL HISTORY: Social History   Socioeconomic History   Marital status: Single    Spouse name: Not on file   Number of children: Not on file   Years of education: Not on file   Highest education level: Not on file  Occupational History   Not on file  Tobacco Use   Smoking status: Former    Types: Cigars    Quit date: 02/10/2014    Years since quitting: 8.3   Smokeless tobacco: Never   Tobacco comments:    no marjuina or cigars or cig since 2016 but vaps  Vaping Use   Vaping Use: Every day  Substance and Sexual Activity   Alcohol use: Yes    Alcohol/week: 1.0 standard drink of alcohol    Types: 1 Standard drinks or equivalent per week    Comment: 2x/week - 2-4 liquior drinks a week   Drug use: No   Sexual activity: Yes    Partners: Female  Other Topics Concern   Not on file  Social History Narrative   Lives with roommate   Right Handed   Drink a preworkout drink 2-3 x/week   Social Determinants of Health   Financial Resource Strain: Not on file  Food Insecurity: Not on file  Transportation Needs: Not on file  Physical Activity: Not on file  Stress: Not on file  Social Connections: Not on file  Intimate Partner  Violence: Not on file     PHYSICAL EXAM  There were no vitals filed for this visit. There is no height or weight on file to calculate BMI.  Generalized: Well developed, in no acute distress  Cardiology: normal rate and rhythm, no murmur auscultated  Respiratory: clear to auscultation bilaterally    Neurological examination  Mentation: Alert oriented to time, place, history taking. Follows all commands speech and language fluent Cranial nerve II-XII: Pupils were equal round reactive to light. Extraocular movements were full, visual field were full on confrontational test. Facial sensation and strength were normal. Uvula tongue midline. Head turning and shoulder shrug  were normal and symmetric. Motor: The motor testing reveals 5 over 5 strength of all 4 extremities. Good symmetric motor tone is  noted throughout.  Sensory: Sensory testing is intact to soft touch on all 4 extremities. No evidence of extinction is noted.  Coordination: Cerebellar testing reveals good finger-nose-finger and heel-to-shin bilaterally.  Gait and station: Gait is normal. Tandem gait is normal. Romberg is negative. No drift is seen.  Reflexes: Deep tendon reflexes are symmetric and normal bilaterally.    DIAGNOSTIC DATA (LABS, IMAGING, TESTING) - I reviewed patient records, labs, notes, testing and imaging myself where available.  Lab Results  Component Value Date   WBC 7.0 03/13/2022   HGB 15.2 03/13/2022   HCT 45.2 03/13/2022   MCV 81 03/13/2022   PLT 286 12/04/2021      Component Value Date/Time   NA 141 03/13/2022 1401   K 4.1 03/13/2022 1401   CL 103 03/13/2022 1401   CO2 24 03/13/2022 1401   GLUCOSE 91 03/13/2022 1401   BUN 11 03/13/2022 1401   CREATININE 0.90 03/13/2022 1401   CALCIUM 9.8 03/13/2022 1401   PROT 7.0 03/13/2022 1401   ALBUMIN 4.6 03/13/2022 1401   AST 43 (H) 03/13/2022 1401   ALT 32 03/13/2022 1401   ALKPHOS 53 03/13/2022 1401   BILITOT 0.3 03/13/2022 1401   GFRNONAA 118  11/09/2019 1135   GFRAA 136 11/09/2019 1135   Lab Results  Component Value Date   CHOL 165 05/01/2016   HDL 34 (L) 05/01/2016   LDLCALC 87 05/01/2016   TRIG 220 (H) 05/01/2016   CHOLHDL 4.9 05/01/2016   Lab Results  Component Value Date   HGBA1C 5.8 (H) 03/13/2022   Lab Results  Component Value Date   VITAMINB12 1,014 06/27/2020   Lab Results  Component Value Date   TSH 1.790 03/13/2022        No data to display               No data to display           ASSESSMENT AND PLAN  31 y.o. year old male  has a past medical history of Allergy and Asthma. here with    No diagnosis found.  Dylann Lilian Coma ***.  Healthy lifestyle habits encouraged. *** will follow up with PCP as directed. *** will return to see me in ***, sooner if needed. *** verbalizes understanding and agreement with this plan.   No orders of the defined types were placed in this encounter.    No orders of the defined types were placed in this encounter.    Shawnie Dapper, MSN, FNP-C 06/18/2022, 11:57 AM  [facility] [address], [address] [place], [place] [address] [phone]

## 2022-06-19 ENCOUNTER — Encounter: Payer: Self-pay | Admitting: Family Medicine

## 2022-06-19 ENCOUNTER — Ambulatory Visit: Payer: BC Managed Care – PPO | Admitting: Family Medicine

## 2022-06-19 VITALS — BP 150/93 | HR 87 | Ht 70.0 in | Wt 226.0 lb

## 2022-06-19 DIAGNOSIS — G6181 Chronic inflammatory demyelinating polyneuritis: Secondary | ICD-10-CM

## 2022-06-19 DIAGNOSIS — R569 Unspecified convulsions: Secondary | ICD-10-CM | POA: Diagnosis not present

## 2022-06-19 DIAGNOSIS — Z79899 Other long term (current) drug therapy: Secondary | ICD-10-CM | POA: Diagnosis not present

## 2022-06-19 DIAGNOSIS — R269 Unspecified abnormalities of gait and mobility: Secondary | ICD-10-CM

## 2022-06-19 NOTE — Patient Instructions (Addendum)
Below is our plan:  We will continue IVIG every 3 weeks. Continue prednisone 7.5mg  daily for now. After we make sure IVIG is approved, I would recommend we try to decrease dose to 5mg  daily. Continue methotrexate 10mg  weekly.   I do not have any hesitations to returning to full duties at work. I would encourage you to continue with PA evaluation. I would recommend consideration of day shift work as this could be beneficial in maintaining health sleep practices and circadian rhythm.  Please make sure you are staying well hydrated. I recommend 50-60 ounces daily. Well balanced diet and regular exercise encouraged. Consistent sleep schedule with 6-8 hours recommended.   Please continue follow up with care team as directed.   Follow up with Dr Terrace Arabia in 6 months   You may receive a survey regarding today's visit. I encourage you to leave honest feed back as I do use this information to improve patient care. Thank you for seeing me today!

## 2022-07-03 ENCOUNTER — Encounter: Payer: Self-pay | Admitting: Family Medicine

## 2022-08-14 ENCOUNTER — Encounter: Payer: Self-pay | Admitting: Family Medicine

## 2022-09-22 ENCOUNTER — Encounter: Payer: Self-pay | Admitting: Family Medicine

## 2022-09-22 MED ORDER — PREDNISONE 1 MG PO TABS: 4.0000 mg | ORAL_TABLET | Freq: Every day | ORAL | 1 refills | Status: DC

## 2022-09-22 MED ORDER — METHOTREXATE SODIUM 2.5 MG PO TABS: 5.0000 mg | ORAL_TABLET | ORAL | 3 refills | Status: DC

## 2022-11-13 ENCOUNTER — Encounter: Payer: Self-pay | Admitting: Family Medicine

## 2022-11-13 ENCOUNTER — Ambulatory Visit: Payer: BC Managed Care – PPO | Admitting: Family Medicine

## 2022-11-13 ENCOUNTER — Other Ambulatory Visit: Payer: Self-pay | Admitting: Family Medicine

## 2022-11-13 MED ORDER — PREDNISONE 1 MG PO TABS: 3.0000 mg | ORAL_TABLET | Freq: Every day | ORAL | 1 refills | Status: AC

## 2022-11-13 MED ORDER — METHOTREXATE SODIUM 2.5 MG PO TABS: 2.5000 mg | ORAL_TABLET | ORAL | 0 refills | Status: AC

## 2022-12-25 ENCOUNTER — Ambulatory Visit: Payer: BC Managed Care – PPO | Admitting: Neurology

## 2022-12-25 ENCOUNTER — Encounter: Payer: Self-pay | Admitting: Neurology

## 2022-12-25 VITALS — BP 148/95 | HR 81 | Ht 69.0 in | Wt 230.5 lb

## 2022-12-25 DIAGNOSIS — R569 Unspecified convulsions: Secondary | ICD-10-CM

## 2022-12-25 DIAGNOSIS — G6181 Chronic inflammatory demyelinating polyneuritis: Secondary | ICD-10-CM

## 2022-12-25 NOTE — Progress Notes (Signed)
Chief Complaint  Patient presents with   Follow-up    Rm14, alone, Chronic inflammatory demyelinating neuropathy: worse in bilateral toes, seizure: none recently,  Gait abnormality   ASSESSMENT AND PLAN  Andre Cole is a 32 y.o. male   Chronic inflammatory demyelinating polyradiculoneuropathy (G61.81)  Gradually onset progressive clinical course since May 2021,    EMG nerve conduction study also support a diagnosis of chronic demyelinating polyradiculoneuropathy, as evidenced by significantly prolonged distal latency, F-wave latency of the ulnar motor nerves tested, temporal dispersion at right tibial motor response, patchy area of sensory loss, there was no axonal loss on EMG study,  Fluoroscopy guided lumbar puncture on November 10, 2019, total protein was 168,   He responded very well to IVIG, initial treatment was in October 2021   Prednisone was started since December 27, 2019, 50 mg daily, reported significant improvement,  He reported recurrent bilateral lower extremity paresthesia, slight unsteady gait after stop prednisone from 5 mg in early April 2022, which is earlier than intended, subjective improvement of his symptoms after restarting higher dose of prednisone up to 10 mg daily,     EMG nerve conduction study on Jun 27, 2020, and May 06, 2021 continue to show evidence of chronic demyelinating polyradiculoneuropathy, no significant change He was on prednisone 10 mg daily since April 2022,  methotrexate 2.5 mg 4 tablets every week was added on since April 2022, along with folic acid daily Broke his left ankle on December 08, 2020, prepared for the surgery, he stopped the prednisone and the methotrexate abruptly on December 18, 2020, began to notice recurrent symptoms  He was treated with higher tapering dose of prednisone 50 mg every morning in early 2023, with improvement with his symptoms   Restarted methotrexate 2.5 mg 6 tablets daily in March 2023, along with folic  acid Continued IVIG, but had his first generalized seizure December 04, 2021 at the end of IVIG infusion, which has been on hold, Restart IVIG, methotrexate 2.5 mg 4 tablets daily, lower dose of prednisone since Feb 2024, now function well, on tapering dose of prednisone and methotrexate, repeat EMG/NCS, will remain on IVIG monotherapy.   DIAGNOSTIC DATA (LABS, IMAGING, TESTING) - I reviewed patient records, labs, notes, testing and imaging myself where available. August 02, 2020, normal IgA, CMP, CPK, TSH, hepatitis B, C, RPR, B12, CBC   HISTORICAL  Andre Cole is a 32 year old male, seen in request by my colleague Dr. Miachel Roux for evaluation of gradual onset bilateral upper and lower extremity paresthesia, gait abnormality, electrodiagnostic study  I reviewed and summarized the referring note. 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 MRI of cervical spine, no significant abnormality  Lab in 2021, normal ESR, C-reactive protein, ANA, rheumatoid factor, CPK,  EMG nerve conduction study today November 09, 2019, showed evidence of findings consistent with CIDP,  there is evidence of  significantly prolonged distal latency, and F-wave latency on all the motor nerves tested.  There is also evidence of right tibial motor temporal dispersion, patchy area of sensory loss.  EMG nerve conduction study showed mild decreased recruitment, no evidence of active denervation.   CSF study on November 10, 2019 showed total protein of 168, confirming the diagnosis of CIDP,  Laboratory evaluation showed no treatable etiology, vitamin D was 28, normal CBC, hemoglobin of 15.4, normal negative protein electrophoresis, ANA, hepatitis panel, ESR, thyroid panel, HIV folic acid, C-reactive protein  IVIG treatment in early October 2021, began to show improvement, prednisone 50 mg since December 27, 2019, a day later, he noticed significant improvement, but some flareup of subjective weakness and numbness on prednisone dosage was tapered down, especially since his ankle fracture in October 2022, he has to stop all treatment for a while,   He was then treated with IVIG 1 g/kg every 3 weeks, prednisone 10 mg daily to avoid long-term side effect, add on methotrexate 2.5 mg maximum 6 tablets in March 2023, no showed significant improvement, no functional limitation only mild lower extremity paresthesia, we will continue tapering off methotrexate and prednisone, maintained on IVIG monotherapy  PHYSICAL EXAM   Vitals:   06/06/20 1419  BP: 140/81  Pulse: 86  Weight: 184 lb 8 oz (83.7 kg)  Height: 5\' 9"  (1.753 m)   Not recorded     Body mass index is 27.25 kg/m.  PHYSICAL EXAMNIATION:  Gen: NAD, conversant, well nourised, well groomed    NEUROLOGICAL EXAM:  MENTAL STATUS: Speech/cognition: Awake alert oriented to history taking and casual conversation   CRANIAL NERVES: CN II: Visual fields are full to confrontation. Pupils are round equal and briskly reactive to light. CN III, IV, VI: extraocular movement are normal. No ptosis. CN V: Facial sensation is intact to  light touch CN VII: Face is symmetric with normal eye closure  CN VIII: Hearing is normal to causal conversation. CN IX, X: Phonation is normal. CN XI: Head turning and shoulder shrug are intact  MOTOR: He has mild bilateral toe extension weakness  REFLEXES: Areflexia  SENSORY: Preserved bilateral toe proprioception, pinprick, vibratory sensation to 5 seconds  COORDINATION: There is no trunk or limb dysmetria noted.  GAIT/STANCE: Steady mild difficulty perform tiptoe and heel walking,  Mild positive Romberg signs  REVIEW OF SYSTEMS: Full 14 system review of systems performed and notable only for as above All other review of systems were negative.  ALLERGIES: No Known Allergies  HOME MEDICATIONS: Current Outpatient Medications  Medication Sig Dispense Refill   methotrexate (RHEUMATREX) 2.5 MG tablet Take 1 tablet (2.5 mg total) by mouth once a week. Caution:Chemotherapy. Protect from light. 10 tablet 0   Multiple Vitamin (MULTIVITAMIN) tablet Take 1 tablet by mouth daily.     predniSONE (DELTASONE) 1 MG tablet Take 3 tablets (3 mg total) by mouth daily with breakfast. 90 tablet 1   TURMERIC PO Take 1 tablet by mouth daily.     No current facility-administered medications for this visit.    PAST MEDICAL HISTORY: Past Medical History:  Diagnosis Date   Allergy    Asthma     PAST SURGICAL HISTORY: History reviewed. No pertinent surgical history.  FAMILY HISTORY: History reviewed. No pertinent family history.  SOCIAL HISTORY: Social History   Socioeconomic History   Marital status: Single  Spouse name: Not on file   Number of children: 1   Years of education: Not on file   Highest education level: Bachelor's degree (e.g., BA, AB, BS)  Occupational History   Not on file  Tobacco Use   Smoking status: Former    Types: Cigars    Quit date: 02/10/2014    Years since quitting: 8.8   Smokeless tobacco: Never   Tobacco comments:    no marjuina or cigars or cig  since 2016 but vaps  Vaping Use   Vaping status: Former  Substance and Sexual Activity   Alcohol use: Not Currently    Alcohol/week: 1.0 standard drink of alcohol    Types: 1 Standard drinks or equivalent per week    Comment: 2x/week - 2-4 liquior drinks a week   Drug use: No   Sexual activity: Yes    Partners: Female  Other Topics Concern   Not on file  Social History Narrative   Lives with roommate   Right Handed   Drink a preworkout drink 2-3 x/week   Social Determinants of Health   Financial Resource Strain: Not on file  Food Insecurity: Not on file  Transportation Needs: Not on file  Physical Activity: Not on file  Stress: Not on file  Social Connections: Not on file  Intimate Partner Violence: Not on file     Levert Feinstein, M.D. Ph.D.  [facility] [address] [place], [place] [address] Ph: [phone] Fax: [phone]  CC:  No referring provider defined for this encounter.  Scifres, Nicole Cella, PA-C (Inactive)

## 2023-02-11 ENCOUNTER — Encounter: Payer: Self-pay | Admitting: Family Medicine

## 2023-03-18 ENCOUNTER — Encounter: Payer: 59 | Admitting: Neurology

## 2023-03-24 ENCOUNTER — Telehealth: Payer: Self-pay | Admitting: Neurology

## 2023-03-24 ENCOUNTER — Other Ambulatory Visit: Payer: Self-pay

## 2023-03-24 DIAGNOSIS — G6181 Chronic inflammatory demyelinating polyneuritis: Secondary | ICD-10-CM

## 2023-03-24 NOTE — Telephone Encounter (Signed)
Extend his ivig to every 4 weeks, has stop Prednisone and methotrexate since Nov 2024. No change noticed.Keep March 4th NCS appt

## 2023-03-25 ENCOUNTER — Encounter: Payer: Self-pay | Admitting: Neurology

## 2023-03-25 LAB — BASIC METABOLIC PANEL
BUN/Creatinine Ratio: 19 (ref 9–20)
BUN: 16 mg/dL (ref 6–20)
CO2: 23 mmol/L (ref 20–29)
Calcium: 9.8 mg/dL (ref 8.7–10.2)
Chloride: 103 mmol/L (ref 96–106)
Creatinine, Ser: 0.86 mg/dL (ref 0.76–1.27)
Glucose: 86 mg/dL (ref 70–99)
Potassium: 4.6 mmol/L (ref 3.5–5.2)
Sodium: 139 mmol/L (ref 134–144)
eGFR: 118 mL/min/{1.73_m2} (ref >59)

## 2023-04-14 ENCOUNTER — Ambulatory Visit: Payer: 59 | Admitting: Neurology

## 2023-04-14 ENCOUNTER — Ambulatory Visit (INDEPENDENT_AMBULATORY_CARE_PROVIDER_SITE_OTHER): Payer: 59 | Admitting: Neurology

## 2023-04-14 VITALS — BP 133/89 | HR 116 | Ht 69.0 in

## 2023-04-14 DIAGNOSIS — G6181 Chronic inflammatory demyelinating polyneuritis: Secondary | ICD-10-CM

## 2023-04-14 DIAGNOSIS — R569 Unspecified convulsions: Secondary | ICD-10-CM | POA: Diagnosis not present

## 2023-04-14 DIAGNOSIS — R269 Unspecified abnormalities of gait and mobility: Secondary | ICD-10-CM

## 2023-04-14 DIAGNOSIS — Z79899 Other long term (current) drug therapy: Secondary | ICD-10-CM

## 2023-04-14 MED ORDER — PREDNISONE 10 MG PO TABS: 40.0000 mg | ORAL_TABLET | Freq: Every day | ORAL | 11 refills | Status: AC

## 2023-04-14 NOTE — Progress Notes (Signed)
 Chief Complaint  Patient presents with   Procedure    Rm EMG/NCV 4.   ASSESSMENT AND PLAN  Andre Cole is a 33 y.o. male   Chronic inflammatory demyelinating polyradiculoneuropathy (G61.81)  Gradually onset progressive clinical course since May 2021,    EMG nerve conduction study in September 2021 support a diagnosis of chronic demyelinating polyradiculoneuropathy, as evidenced by significantly prolonged distal latency, F-wave latency of the ulnar motor nerves tested, temporal dispersion at right tibial motor response, patchy area of sensory loss, there was no axonal loss on EMG study,  Fluoroscopy guided lumbar puncture on November 10, 2019, total protein was 168,   He responded very well to IVIG, initial treatment was in October 2021   Prednisone was started since December 27, 2019, 50 mg daily, reported significant improvement,  Repeat EMG nerve conduction study on Jun 27, 2020, and May 06, 2021 continue to show evidence of chronic demyelinating polyradiculoneuropathy, no significant change Methotrexate 2.5 mg 4 tablets every week was added on since April 2022, along with folic acid daily as steroid sparing agent Broke his left ankle on December 08, 2020, prepared for the surgery, he stopped the prednisone and the methotrexate abruptly on December 18, 2020, began to notice recurrent symptoms   Restarted methotrexate 2.5 mg 6 tablets daily in March 2023, along with folic acid Continued IVIG, but had his first generalized seizure December 04, 2021 at the end of IVIG infusion, which has been on hold, Restart IVIG, methotrexate 2.5 mg 4 tablets daily, lower dose of prednisone since Feb 2024, because he was doing well, worry about medication side effect, methotrexate and prednisone was tapered up at the end of 2024, he also had a Missing few IVIG infusion due to insurance reasons around the same time, then noticed slight worsening of lower extremity paresthesia, gait abnormality,  Repeat  EMG nerve conduction study March 2025 showed slight worsening, evidence of distal axonal loss, reloaded IVIG 2 g/kg, starting prednisone 40 mg daily for 4 weeks, tapering schedule will depend on how he responded, the other option was also discussed including Vyvgart Hytrulo  DIAGNOSTIC DATA (LABS, IMAGING, TESTING) - I reviewed patient records, labs, notes, testing and imaging myself where available. August 02, 2020, normal IgA, CMP, CPK, TSH, hepatitis B, C, RPR, B12, CBC   HISTORICAL  Andre Cole is a 33 year old male, seen in request by my colleague Dr. Miachel Roux for evaluation of gradual onset bilateral upper and lower extremity paresthesia, gait abnormality, electrodiagnostic study  I reviewed and summarized the referring note. 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 MRI of cervical spine, no significant abnormality  Lab in 2021, normal ESR, C-reactive protein, ANA, rheumatoid factor, CPK,  EMG nerve conduction study today November 09, 2019, showed evidence  of findings consistent with CIDP, there is evidence of  significantly prolonged distal latency, and  F-wave latency on all the motor nerves tested.  There is also evidence of right tibial motor temporal dispersion, patchy area of sensory loss.  EMG nerve conduction study showed mild decreased recruitment, no evidence of active denervation.   CSF study on November 10, 2019 showed total protein of 168, confirming the diagnosis of CIDP,  Laboratory evaluation showed no treatable etiology, vitamin D was 28, normal CBC, hemoglobin of 15.4, normal negative protein electrophoresis, ANA, hepatitis panel, ESR, thyroid panel, HIV folic acid, C-reactive protein  IVIG treatment in early October 2021, began to show improvement, prednisone 50 mg since December 27, 2019, a day later, he noticed significant improvement, but some flareup of subjective weakness and numbness on prednisone dosage was tapered down, especially since his ankle fracture in October 2022, he has to stop all treatment for a while,   He was then treated with IVIG 1 g/kg every 3 weeks, prednisone 10 mg daily to avoid long-term side effect, add on methotrexate 2.5 mg maximum 6 tablets in March 2023, no showed significant improvement, no functional limitation only mild lower extremity paresthesia, we will continue tapering off methotrexate and prednisone, maintained on IVIG monotherapy  UPDATE March 4th 2025: He return for electrodiagnostic study today showed worsening compared to previous study, evidence of distal axonal loss,  He has tapered off methotrexate, prednisone end of 2024, due to insurance reasons, there was a gap of his regular IVIG treatment, at the end of 2025 he noticed mild worsening of symptoms, increased toe weakness, could not feel the ground, mild increased gait abnormality  PHYSICAL EXAM   Vitals:   06/06/20 1419  BP: 140/81  Pulse: 86  Weight: 184 lb 8 oz (83.7 kg)  Height: 5\' 9"  (1.753 m)   Body mass index is 27.25 kg/m.  PHYSICAL EXAMNIATION:  Gen: NAD, conversant, well nourised, well groomed     NEUROLOGICAL EXAM:  MENTAL STATUS: Speech/cognition: Awake alert oriented to history taking and casual conversation   CRANIAL NERVES: CN II: Visual fields are full to confrontation. Pupils are round equal and briskly reactive to light. CN III, IV, VI: extraocular movement are normal. No ptosis. CN V: Facial sensation is intact to light touch CN VII: Face is symmetric with normal eye closure  CN VIII: Hearing is normal to causal conversation. CN IX, X: Phonation is normal. CN XI: Head turning and shoulder shrug are intact  MOTOR: He has mild bilateral toe extension weakness  REFLEXES: Areflexia  SENSORY: Preserved bilateral toe proprioception, pinprick, vibratory sensation to 5 seconds  COORDINATION: There is no trunk or limb dysmetria noted.  GAIT/STANCE: unsteady,    difficulty perform tiptoe and heel walking,  Mild to moderate positive Romberg signs  REVIEW OF SYSTEMS: Full 14 system review of systems performed and notable only for as above All other review of systems were negative.  ALLERGIES: No Known Allergies  HOME MEDICATIONS: Current Outpatient Medications  Medication Sig Dispense Refill   methotrexate (RHEUMATREX) 2.5 MG tablet Take 1 tablet (2.5 mg total) by mouth once a week. Caution:Chemotherapy. Protect from light. 10 tablet 0   Multiple Vitamin (MULTIVITAMIN) tablet Take 1 tablet by mouth daily.     predniSONE (DELTASONE) 1 MG tablet Take 3 tablets (3 mg total) by mouth daily with breakfast. 90 tablet 1   TURMERIC PO Take 1 tablet by mouth daily.     No current facility-administered medications for this visit.  PAST MEDICAL HISTORY: Past Medical History:  Diagnosis Date   Allergy    Asthma     PAST SURGICAL HISTORY: No past surgical history on file.  FAMILY HISTORY: No family history on file.  SOCIAL HISTORY: Social History   Socioeconomic History   Marital status: Single    Spouse name: Not on file   Number of children: 1   Years of  education: Not on file   Highest education level: Bachelor's degree (e.g., BA, AB, BS)  Occupational History   Not on file  Tobacco Use   Smoking status: Former    Types: Cigars    Quit date: 02/10/2014    Years since quitting: 9.1   Smokeless tobacco: Never   Tobacco comments:    no marjuina or cigars or cig since 2016 but vaps  Vaping Use   Vaping status: Former  Substance and Sexual Activity   Alcohol use: Not Currently    Alcohol/week: 1.0 standard drink of alcohol    Types: 1 Standard drinks or equivalent per week    Comment: 2x/week - 2-4 liquior drinks a week   Drug use: No   Sexual activity: Yes    Partners: Female  Other Topics Concern   Not on file  Social History Narrative   Lives with roommate   Right Handed   Drink a preworkout drink 2-3 x/week   Social Drivers of Health   Financial Resource Strain: Not on file  Food Insecurity: Not on file  Transportation Needs: Not on file  Physical Activity: Not on file  Stress: Not on file  Social Connections: Not on file  Intimate Partner Violence: Not on file     Levert Feinstein, M.D. Ph.D.  [facility] [address], [address] [place], [place] [address] Ph: [phone] Fax: [phone]  CC:  No referring provider defined for this encounter.  Scifres, Nicole Cella, PA-C (Inactive)

## 2023-04-14 NOTE — Procedures (Signed)
 Full Name: Andre Cole Gender: Male MRN #: [id number] Date of Birth: [birth year]    Visit Date: 04/14/2023 13:01 Age: 33 Years Examining Physician: Dr. Levert Feinstein Referring Physician: Dr. Levert Feinstein Height: 5 feet 9 inch History: 33 year old male with CIDP, mild worsening of symptoms after stopping prednisone, methotrexate, missing his IVIG  Summary of the test: Nerve conduction study: Bilateral sural, superficial peroneal, right median, ulnar and medial sensory responses were absent.  Bilateral tibial, left peroneal to EDB, right median motor responses were absent.  Right peroneal to EDB motor response showed mildly decreased CMAP amplitude.  Right ulnar motor responses showed significantly prolonged distal latency, significantly decreased CMAP amplitude.  Right ulnar F-wave latency was absent.  Electromyography: Selected needle examinations were performed at right upper, lower extremity muscles.  There was evidence of distal right lower extremity axonal loss, active denervation with chronic neuropathic changes.   Conclusion:  This is an abnormal study.  There is electrodiagnostic evidence of chronic neuropathy, with mixed axonal demyelinating features.  Compared to previous study in March 2023, there was evidence of worsening.  Levert Feinstein. M.D. Ph.D.   [facility] [address] [place], [place] [address] Tel: [phone] Fax: [phone]  Verbal informed consent was obtained from the patient, patient was informed of potential risk of procedure, including bruising, bleeding, hematoma formation, infection, muscle weakness, muscle pain, numbness, among others.        MNC    Nerve / Sites Muscle Latency Ref. Amplitude Ref. Rel Amp Segments Distance Velocity Ref. Area    ms ms mV mV %  cm m/s m/s mVms  R Median - APB     Wrist APB NR <=4.4 NR >=4.0 NR Wrist - APB 7   NR  R Ulnar - ADM     Wrist ADM 8.8 <=3.3 1.1 >=6.0 100 Wrist - ADM 7    4.1     B.Elbow ADM 10.9  2.2  194 B.Elbow - Wrist 12 57 >=49 9.8     A.Elbow ADM 14.9  3.5  162 A.Elbow - B.Elbow 17.6 43 >=49 15.9  R Peroneal - EDB     Ankle EDB 18.5 <=6.5 1.4 >=2.0 100 Ankle - EDB 9   8.6     Fib head EDB 32.1  1.1  77.1 Fib head - Ankle 28 20 >=44      Pop fossa EDB 33.8  1.2  110 Pop fossa - Fib head 10 59 >=44 2.5         Pop fossa - Ankle      L Peroneal - EDB     Ankle EDB NR <=6.5 NR >=2.0 NR Ankle - EDB 9   NR         Pop fossa - Ankle      R Tibial - AH     Ankle AH NR <=5.8 NR >=4.0 NR Ankle - AH 9   NR  L Tibial - AH     Ankle AH NR <=5.8 NR >=4.0 NR Ankle - AH 9   NR                 SNC    Nerve / Sites Rec. Site Peak Lat Ref.  Amp Ref. Segments Distance    ms ms V V  cm  R Radial - Anatomical snuff box (Forearm)     Forearm Wrist NR <=2.9 NR >=15 Forearm - Wrist 10  R Sural - Ankle (  Calf)     Calf Ankle NR <=4.4 NR >=6 Calf - Ankle 14  L Sural - Ankle (Calf)     Calf Ankle NR <=4.4 NR >=6 Calf - Ankle 14  R Superficial peroneal - Ankle     Lat leg Ankle NR <=4.4 NR >=6 Lat leg - Ankle 14  L Superficial peroneal - Ankle     Lat leg Ankle NR <=4.4 NR >=6 Lat leg - Ankle 14  R Median - Orthodromic (Dig II, Mid palm)     Dig II Wrist NR <=3.4 NR >=10 Dig II - Wrist 13  R Ulnar - Orthodromic, (Dig V, Mid palm)     Dig V Wrist NR <=3.1 NR >=5 Dig V - Wrist 81                   F  Wave    Nerve F Lat Ref.   ms ms  R Ulnar - ADM 29.0 <=32.0       EMG Summary Table    Spontaneous MUAP Recruitment  Muscle IA Fib PSW Fasc Other Amp Dur. Poly Pattern  R. Tibialis anterior Increased 1+ 1+ None _______ Increased Increased 1+ Reduced  R. Tibialis posterior Increased 1+ None None _______ Increased Increased 1+ Reduced  R. Peroneus longus Increased None None None _______ Increased Increased 1+ Reduced  R. Gastrocnemius (Medial head) Increased None None None _______ Normal Normal Normal Reduced  R. Vastus lateralis Normal None None None _______ Normal  Normal Normal Reduced  R. First dorsal interosseous Normal None None None _______ Normal Normal Normal Normal  R. Pronator teres Normal None None None _______ Normal Normal Normal Normal  R. Biceps brachii Normal None None None _______ Normal Normal Normal Normal  R. Deltoid Normal None None None _______ Normal Normal Normal Normal  R. Triceps brachii Normal None None None _______ Normal Normal Normal Normal

## 2023-04-16 NOTE — Progress Notes (Signed)
 EMG nerve conduction study report is on the procedure tab

## 2023-04-29 ENCOUNTER — Encounter: Payer: 59 | Admitting: Neurology

## 2023-07-28 ENCOUNTER — Encounter (INDEPENDENT_AMBULATORY_CARE_PROVIDER_SITE_OTHER): Payer: Self-pay | Admitting: Neurology

## 2023-07-28 DIAGNOSIS — G6181 Chronic inflammatory demyelinating polyneuritis: Secondary | ICD-10-CM

## 2023-07-28 DIAGNOSIS — R269 Unspecified abnormalities of gait and mobility: Secondary | ICD-10-CM

## 2023-07-28 DIAGNOSIS — R569 Unspecified convulsions: Secondary | ICD-10-CM

## 2023-07-29 NOTE — Telephone Encounter (Signed)
 Please see the MyChart message reply(ies) for my assessment and plan.    This patient gave consent for this Medical Advice Message and is aware that it may result in a bill to [employer], as well as the possibility of receiving a bill for a co-payment or deductible. They are an established patient, but are not seeking medical advice exclusively about a problem treated during an in person or video visit in the last seven days. I did not recommend an in person or video visit within seven days of my reply.    I spent a total of 5 minutes cumulative time within 7 days through [employer].  Levert Feinstein, MD

## 2023-09-25 ENCOUNTER — Telehealth: Payer: Self-pay

## 2023-09-25 NOTE — Telephone Encounter (Signed)
 Received a fax about an approval-I do not see documentation in chart for PA-will place approval letter into chart under the media tab.

## 2023-09-30 ENCOUNTER — Telehealth: Payer: Self-pay | Admitting: Neurology

## 2023-09-30 NOTE — Telephone Encounter (Signed)
 [name] @ [facility] is asking for a call from RN to discuss infusion Octagam she can be reached at [phone] option 4

## 2023-09-30 NOTE — Telephone Encounter (Signed)
 Call to [name], she is faxing octagam script for us  to verify. Called patient to confirm will still be getting infused at [facility].  Per last note, patient will get loading dose again. LD 50g x 4days= 200 total MD 50g x 2 day q3 weeks =100g q3w last doses 6/9 and 6/10

## 2023-11-16 ENCOUNTER — Encounter: Payer: Self-pay | Admitting: Neurology

## 2023-11-16 NOTE — Addendum Note (Signed)
 Addended byBETHA JOSHUA EARING L on: 11/16/2023 04:00 PM   Modules accepted: Orders

## 2023-12-07 ENCOUNTER — Telehealth: Payer: Self-pay | Admitting: Neurology

## 2023-12-07 NOTE — Telephone Encounter (Signed)
 Appointment has been r/s as it will conflict with his infusion

## 2023-12-08 ENCOUNTER — Ambulatory Visit: Payer: Self-pay | Admitting: Neurology

## 2023-12-21 ENCOUNTER — Ambulatory Visit: Payer: Self-pay | Admitting: Podiatry

## 2024-01-28 NOTE — Telephone Encounter (Signed)
 We received a fax on a PA for Octagam that appears to be needed. Correspondence is from [place] to [facility]. I have given this information to [facility] for processing.

## 2024-04-26 ENCOUNTER — Ambulatory Visit: Payer: Self-pay | Admitting: Neurology
